# Patient Record
Sex: Female | Born: 1935 | Race: Black or African American | Hispanic: No | Smoking: Never smoker
Health system: Southern US, Community
[De-identification: ages and names within clinical notes are randomized; demographics above are authoritative.]

## PROBLEM LIST (undated history)

## (undated) DIAGNOSIS — I509 Heart failure, unspecified: Secondary | ICD-10-CM

## (undated) DIAGNOSIS — I2510 Atherosclerotic heart disease of native coronary artery without angina pectoris: Secondary | ICD-10-CM

## (undated) DIAGNOSIS — I119 Hypertensive heart disease without heart failure: Secondary | ICD-10-CM

## (undated) DIAGNOSIS — I5022 Chronic systolic (congestive) heart failure: Secondary | ICD-10-CM

## (undated) DIAGNOSIS — I252 Old myocardial infarction: Secondary | ICD-10-CM

## (undated) DIAGNOSIS — E785 Hyperlipidemia, unspecified: Secondary | ICD-10-CM

## (undated) DIAGNOSIS — I10 Essential (primary) hypertension: Secondary | ICD-10-CM

## (undated) HISTORY — PX: ABDOMINAL HYSTERECTOMY: SHX81

## (undated) HISTORY — DX: Atherosclerotic heart disease of native coronary artery without angina pectoris: I25.10

---

## 1999-01-11 ENCOUNTER — Emergency Department (HOSPITAL_COMMUNITY): Admission: EM | Admit: 1999-01-11 | Discharge: 1999-01-11 | Payer: Self-pay | Admitting: Emergency Medicine

## 1999-09-24 ENCOUNTER — Emergency Department (HOSPITAL_COMMUNITY): Admission: EM | Admit: 1999-09-24 | Discharge: 1999-09-24 | Payer: Self-pay | Admitting: Emergency Medicine

## 2002-10-16 ENCOUNTER — Encounter: Admission: RE | Admit: 2002-10-16 | Discharge: 2003-01-14 | Payer: Self-pay | Admitting: Family Medicine

## 2003-09-04 ENCOUNTER — Emergency Department (HOSPITAL_COMMUNITY): Admission: EM | Admit: 2003-09-04 | Discharge: 2003-09-04 | Payer: Self-pay | Admitting: Emergency Medicine

## 2005-11-17 ENCOUNTER — Inpatient Hospital Stay (HOSPITAL_COMMUNITY): Admission: EM | Admit: 2005-11-17 | Discharge: 2005-11-24 | Payer: Self-pay | Admitting: Emergency Medicine

## 2005-11-23 ENCOUNTER — Encounter (INDEPENDENT_AMBULATORY_CARE_PROVIDER_SITE_OTHER): Payer: Self-pay | Admitting: Cardiology

## 2005-11-28 ENCOUNTER — Emergency Department (HOSPITAL_COMMUNITY): Admission: EM | Admit: 2005-11-28 | Discharge: 2005-11-28 | Payer: Self-pay | Admitting: Emergency Medicine

## 2005-12-08 ENCOUNTER — Encounter (HOSPITAL_COMMUNITY): Admission: RE | Admit: 2005-12-08 | Discharge: 2006-03-08 | Payer: Self-pay | Admitting: Cardiology

## 2006-02-15 ENCOUNTER — Observation Stay (HOSPITAL_COMMUNITY): Admission: EM | Admit: 2006-02-15 | Discharge: 2006-02-18 | Payer: Self-pay | Admitting: Emergency Medicine

## 2006-02-16 ENCOUNTER — Encounter (INDEPENDENT_AMBULATORY_CARE_PROVIDER_SITE_OTHER): Payer: Self-pay | Admitting: Cardiology

## 2006-07-18 ENCOUNTER — Emergency Department (HOSPITAL_COMMUNITY): Admission: EM | Admit: 2006-07-18 | Discharge: 2006-07-18 | Payer: Self-pay | Admitting: Emergency Medicine

## 2006-12-05 IMAGING — CR DG CHEST 1V PORT
1 series · 1 of 1 positions shown · non-contrast
Comparison: 11/17/2005.

CLINICAL DATA: 79-year-old female, chest pain.  
 PORTABLE CHEST - 1 VIEW:

[view not recorded]
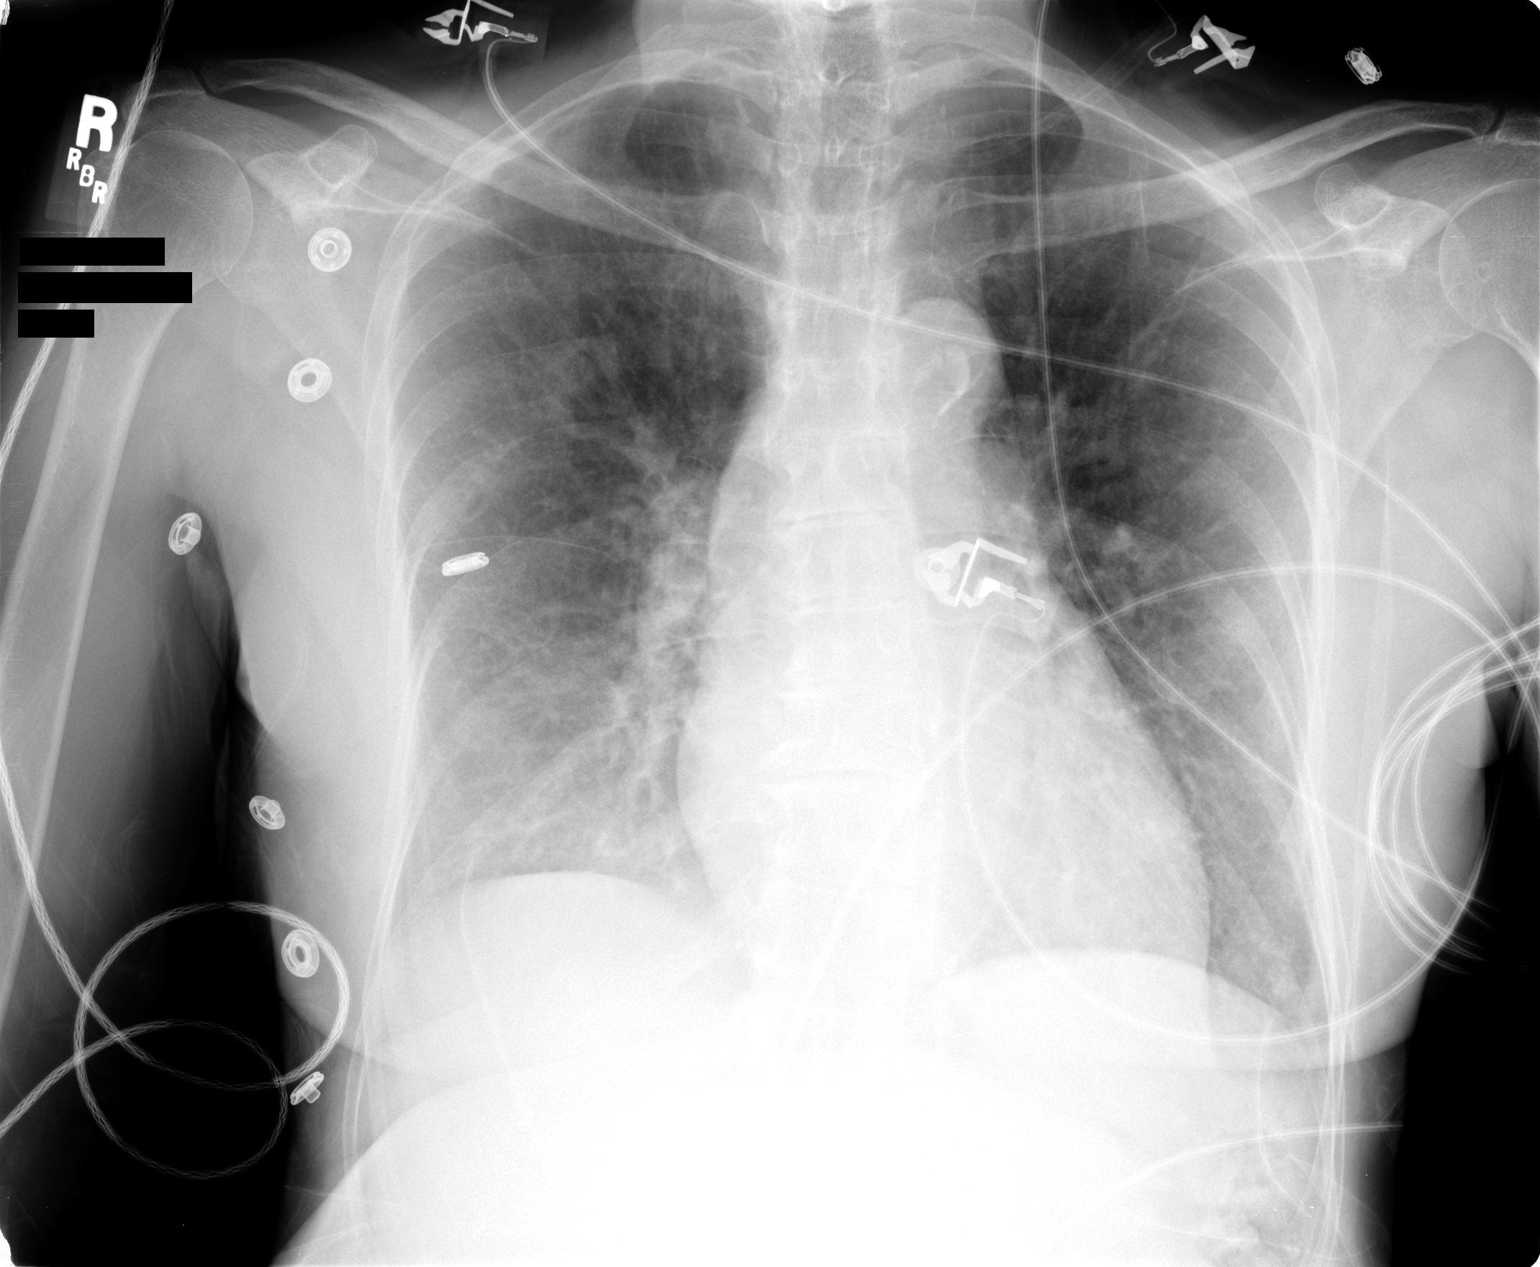

[1 of 1 positions shown; findings below may reference images not displayed]

FINDINGS: There is stable cardiomegaly with central bronchitic change and mild interstitial prominence.  No significant change in aeration.  No effusion or pneumothorax.  Lungs remain hyperinflated.
IMPRESSION: Cardiomegaly with mild interstitial prominence, and background COPD.

## 2007-03-04 IMAGING — CR DG CHEST 2V
2 series · 2 of 2 positions shown · non-contrast
Comparison: 02/15/06.

CLINICAL DATA: 79-year-old with CHF.
 CHEST ? 2 VIEW:

[view not recorded (1 of 2)]
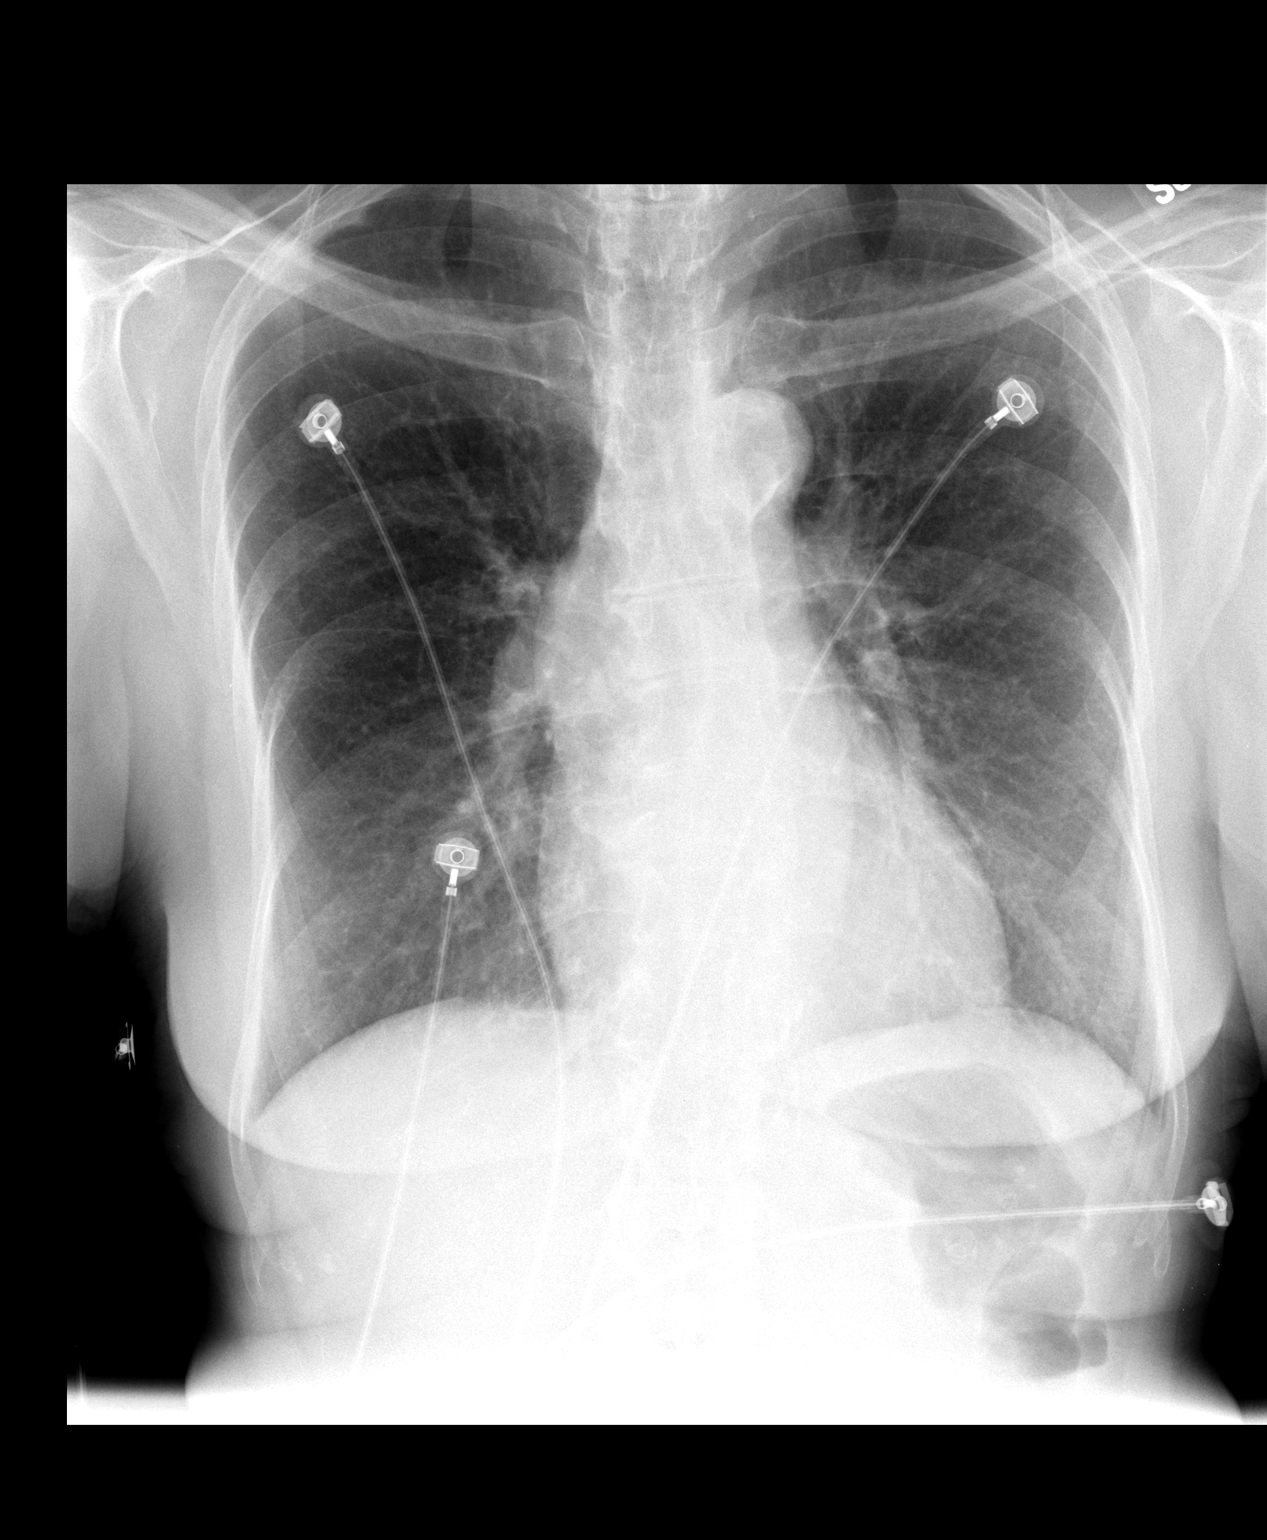

[view not recorded (2 of 2)]
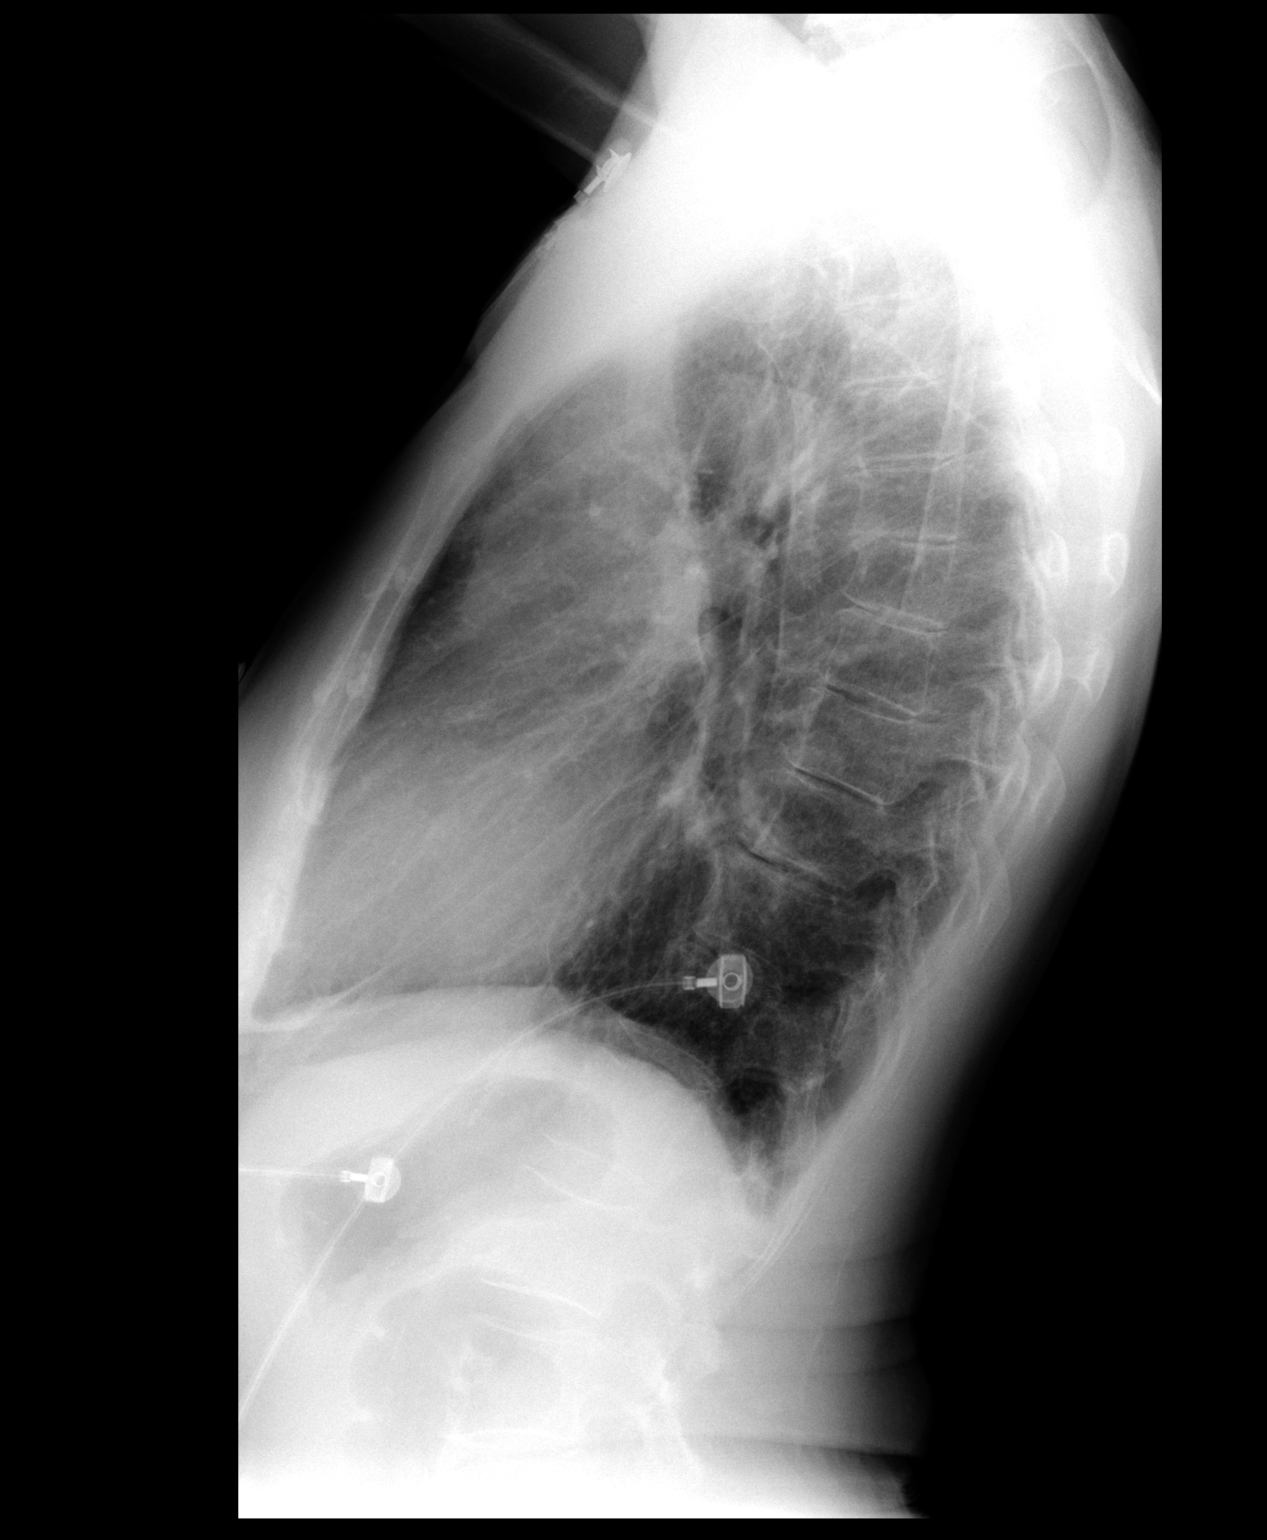

[2 of 2 positions shown; findings below may reference images not displayed]

FINDINGS: Cardiac silhouette, mediastinal and hilar contours are within normal limits.  Much improved aeration when compared to prior examination with resolution of CHF.  No effusions or infiltrates.
IMPRESSION: No acute cardiopulmonary findings.  Resolution of CHF.

## 2010-04-13 ENCOUNTER — Encounter: Admission: RE | Admit: 2010-04-13 | Discharge: 2010-04-13 | Payer: Self-pay | Admitting: Cardiology

## 2011-03-18 NOTE — H&P (Signed)
NAME:  Kristine Valdez, Kristine Valdez NO.:  [id number]   MEDICAL RECORD NO.:  [id number]          PATIENT TYPE:  INP   LOCATION:  1843                         FACILITY:  [facility]   PHYSICIAN:  Peter M. Swaziland, M.D.  DATE OF BIRTH:  10/31/1926   DATE OF ADMISSION:  11/17/2005  DATE OF DISCHARGE:                                HISTORY & PHYSICAL   HISTORY OF PRESENT ILLNESS:  Kristine Valdez is a very pleasant 77 year old  black female who presents with worsening chest pain today. She has a history  of a remote coronary stent approximately 10 years ago by Dr. Juanda Chance. She was  subsequently followed by Dr. Donnie Aho. She states she has not seen a  cardiologist in years. Approximately 1 week ago she developed some chest  pain with exertion that resolved with rest. Today, while carrying a load of  wood for her fire, she developed substernal chest pain. This resolved with  rest. She was subsequently making her bed when her pain recurred and did not  resolve until she presented to [facility] and she was  given two sublingual nitroglycerin and aspirin with resolution of her pain.  Now she states she just feels tired in her chest.   PAST MEDICAL HISTORY:  1.  Diabetes mellitus type 2, diet controlled.  2.  Hypertension.  3.  Hypercholesterolemia.  4.  History of sciatica.  5.  Depression.   MEDICATIONS:  Include aspirin and multivitamin daily. She is on Lopressor,  unknown dose, and hydrochlorothiazide.   ALLERGIES:  She is intolerant of MULTIPLE CHOLESTEROL MEDICATIONS which she  states causes her to have stomach cramps.   SOCIAL HISTORY:  She is married with multiple children. She denies tobacco  or alcohol use.   FAMILY HISTORY:  Father died of myocardial infarction. She had a brother who  had coronary bypass surgery and mother died with complications of diabetes.   REVIEW OF SYSTEMS:  Otherwise unremarkable.   PHYSICAL EXAMINATION:  GENERAL:  The patient is a  pleasant black female in  no distress.  VITAL SIGNS:  Her blood pressure is 130/70, pulse is 88 and regular,  respirations are normal, she is afebrile.  HEENT:  She is normocephalic, atraumatic. Pupils equal, round, and reactive  to light and accommodation. Oropharynx is clear.  NECK:  Supple without JVD, adenopathy, thyromegaly, or bruits.  LUNGS:  Clear.  CARDIAC:  Reveals regular rate and rhythm without gallop, murmur, rub, or  click.  ABDOMEN:  Soft and nontender. She has no masses or hepatosplenomegaly.  Femoral and pedal pulses are 2+ and symmetric. She has no edema.  NEUROLOGIC:  Nonfocal.   LABORATORY DATA:  ECG shows normal sinus rhythm with an old anterior septal  myocardial infarction. She has significant ST segment depression in leads I,  II, aVL, and V4 through V6 consistent with lateral ischemia, which are new.   IMPRESSION:  1.  Unstable angina, rule out myocardial infarction.  2.  Diabetes mellitus type 2.  3.  Hypertension.  4.  Hypercholesterolemia.   PLAN:  Will admit to telemetry. She will be  ruled out for myocardial  infarction. Will obtain routine labs, cardiac enzymes, chest x-ray. She will  be started on aspirin, IV heparin, IV nitroglycerin, and Integrilin. Will  continue her on her Lopressor and I will plan on cardiac catheterization in  the morning.           ______________________________  Peter M. [place]iland, M.D.     PMJ/MEDQ  D:  11/17/2005  T:  11/18/2005  Job:  [id number]   cc:   Windle Guard, M.D.  Fax: [phone]

## 2011-03-18 NOTE — Discharge Summary (Signed)
NAME:  Kristine Valdez, Kristine Valdez NO.:  [id number]   MEDICAL RECORD NO.:  [id number]          PATIENT TYPE:  INP   LOCATION:  4734                         FACILITY:  [facility]   PHYSICIAN:  Georga Hacking, M.D.DATE OF BIRTH:  1925-11-06   DATE OF ADMISSION:  11/17/2005  DATE OF DISCHARGE:  11/24/2005                                 DISCHARGE SUMMARY   FINAL DIAGNOSIS:  1.  Anterior infarction. Initial episode.  2.  Coronary artery disease.      1.  Status post stent of total occlusion of proximal left anterior          descending coronary at the site of a previous occlusion.  3.  Hypertension.  4.  Hyperlipidemia.  5.  Diabetes mellitus type 2.  6.  History of sciatica.  7.  Depression.   PROCEDURES:  Cardiac catheterization with stenting of the left inferior  descending.   HISTORY:  The patient is a 77 year old female who has a history of an  anterior infarction in 1995 treated with a coronary stent. She is known to  me for several years but has not been seen in about 9-10 years and has been  followed by her family physician. Around a week ago she developed exertional  substernal chest pain relieved with rest but developed increasing chest pain  the day of admission while carrying a load or wood in for her fire.  Pain  resolved with rest, but while making her bed, her pain returned and she  presented to [facility] and was given two nitroglycerin  sublingual and sent to the emergency room. Please see the previously  dictated history and physical for remainder of the details.   HOSPITAL COURSE:  Laboratory data on admission showed a sodium of 134,  potassium 3.6, chloride 102, glucose of 159, BUN was 17 with a creatinine of  1.1.  Initial CPK was 69 with MB of 1.7. Troponin was 0.03 on admission.  Her PT/PTT were normal.   She was placed on Integralin, heparin and IV nitroglycerin. She was held in  the emergency room overnight and had pain that  waxed and waned overnight  although the physician was not consulted.  T he next morning when she was  seen, her CPK had risen by 6:00 a.m. to 766 with 144 MV and a troponin of  4.71. She was taken to the cardiac catheterization laboratory where she was  found to have an occluded left anterior descending coronary artery.  The  circumflex had only mild disease in it.  The right coronary artery was a  nondominant vessel with an ostial 80-90% stenosis. She underwent angioplasty  and stenting of the LAD with late reperfusion being achieved due to the  length of time from admission to the onset of stenting. She had a 3.0 x 20  mm Taxus stent placed in the proximal LAD and had an excellent angiographic  results. She was watched in the intensive care unit for a couple of days.  Her CPK peaked at 2193 with 310 units of MD with rapid washout.  She was  placed on beta-blockers, ACE inhibitors and was seen by cardiac rehab. She  was initiated under lipid lowering therapy with Crestor.  She gradually  improved.  Her glucoses were in the mildly elevated range. Hemoglobin A1C  was 5.7.  After an appropriate period of time of rehab and to gain her  strength, she was discharged in improved condition. She refused to  Pneumovax, but was willing to take a flu vaccination.   She is discharged at this time in improved condition on:  1.  Aspirin 81 milligrams daily.  2.  Plavix 75 milligrams daily.  3.  Hydrochlorothiazide 25 milligrams daily.  4.  Vytorin 10/20 mg daily.  5.  Plavix 75 milligrams daily.  6.  Lisinopril 10 milligrams daily.  7.  Lopressor 50 milligrams b.i.d.   She is discharged to continue rehab and is to have outpatient rehab. She  will be seen by me in 1-2 weeks and call sooner if there are problems. She  was given a prescription for nitroglycerin also.      Georga Hacking, M.D.  Electronically Signed     WST/MEDQ  D:  11/24/2005  T:  11/24/2005  Job:  [id number]   cc:   Windle Guard, M.D.  Fax: [phone]

## 2011-03-18 NOTE — Discharge Summary (Signed)
NAME:  Kristine Valdez, Kristine Valdez NO.:  [id number]   MEDICAL RECORD NO.:  [id number]          PATIENT TYPE:  INP   LOCATION:  3713                         FACILITY:  [facility]   PHYSICIAN:  Georga Hacking, M.D.DATE OF BIRTH:  24-Dec-1925   DATE OF ADMISSION:  02/15/2006  DATE OF DISCHARGE:  02/18/2006                                 DISCHARGE SUMMARY   FINAL DIAGNOSES:  1.  Congestive heart failure.  2.  Coronary artery disease with previous anterior infarction.  3.  Possible apical mural thrombus.  4.  Hypertension.  5.  Hyperlipidemia.  6.  Peripheral vascular disease.   PROCEDURES:  Echocardiogram.   HISTORY:  This 77 year old female was admitted for treatment of heart  failure and elevated blood pressure.  She has a previous history of  hypertension, hyperlipidemia, previous anterior MI in 1995, treated with  angioplasty.  She had a subacute anterior infarction with a somewhat light  presentation.  Had stenting done in January, 2007 with a Taxus stent.  She  was unable to tolerate ACE inhibitors because of cough and later switched to  Micardis, it was thought because it made her blurry.  She had dyspnea on  exertion and continued taking HCTZ several weeks ago.  She noticed  increasing fatigue with exertion as well as PND, orthopnea, and worsening  shortness of breath.  She saw Dr. Jeannetta Nap on Monday.  He wrote the Mucinex,  thinking it was allergies.  She presented to the emergency room being unable  to sleep at night and was really having difficulty and was admitted for  treatment of congestive heart failure.  Please see the previously dictated  history and physical for the remainder of the details.   HOSPITAL COURSE:  Laboratory data on admission showed a hemoglobin of 12.8,  hematocrit 38.3.  Pro time, INR was 1.2.  BUN was 11 on admission with a  creatinine of 1.  Sodium is 136, potassium 4.3, chloride 104.  Hemoglobin  A1C was 5.2.  AST was 57, ALT 58.  B-type  natriuretic peptide was 875.  TSH  was 1.488.   Chest x-ray showed congestive heart failure.   EKG showed a previous anterior infarction.   Patient was admitted to the hospital and was given intravenous Lasix.  Echocardiogram showed an EF of around 25% with the possibility of an apical  mural thrombus.  She was seen the next day and was feeling better with no  PND and denied chest pain.   BNP level was 875, and chest x-ray was consistent with heart failure.   An echocardiogram was checked, and she was changed to Lasix the next  morning.  On the 20th, she felt better with no PND, orthopnea, and less  fatigue but had a more rapid heart rate.  She was changed to Coreg, and it  was noted that her BUN had risen to 29 from 11 and her creatinine to 1.4  from 0.9.  She had been started on spironolactone, and she was given a trial  of Atacand as well as Aldactone.  The next day, her potassium  was 5.2, and  the Aldactone was held.  It was thought necessary to keep her in the  hospital until her renal function had stabilized.  Her BUN was 334 with a  creatinine of 1.4 the next day and was thought to be stable.  BNP level had  now dropped to 70.   She was discharged in improved condition that next day to continue  outpatient treatment.  She also is watched on Atacand.   DISCHARGE MEDICATIONS:  1.  Plavix 75 mg daily.  2.  Vytorin 10/20 mg daily.  3.  Atacand 4 mg daily.  4.  Lasix 40 mg daily.  5.  Coreg 6.25 mg b.i.d.  6.  Coumadin 5 mg daily.  7.  Aspirin 81 mg daily.  8.  She is to discontinue Lopressor.   It is recommended that she follow up in the office on April 24 for blood  work and is to call if there are problems.      Georga Hacking, M.D.  Electronically Signed     WST/MEDQ  D:  03/15/2006  T:  03/16/2006  Job:  [id number]   cc:   Windle Guard, M.D.  Fax: [phone]

## 2011-03-18 NOTE — Cardiovascular Report (Signed)
NAME:  Kristine Valdez, KRZYWICKI NO.:  [id number]   MEDICAL RECORD NO.:  [id number]          PATIENT TYPE:  INP   LOCATION:  2116                         FACILITY:  [facility]   PHYSICIAN:  Georga Hacking, M.D.DATE OF BIRTH:  1926-02-25   DATE OF PROCEDURE:  11/18/2005  DATE OF DISCHARGE:                              CARDIAC CATHETERIZATION   HISTORY:  The patient is a 77 year old black female with a previous anterior  infarction who had a J&J stent placed in the LAD 10 years ago in the setting  of an acute infarction.  She has not been seen by a cardiologist in years.  One week ago she developed severe chest discomfort with exertion that  resolved and developed worsening chest discomfort the day of admission.  It  eased off but later while making her bed she had recurrent pain that did not  resolve until she took two nitroglycerin sublingually at [facility].  The pain had resolved and she was feeling tired on  admission to the emergency room.  EKG showed some ST depression and some ST  elevation.  She was not in any acute distress and was placed on heparin and  denied chest pain.  She had recurrent chest discomfort last evening around  10 o'clock and had chest discomfort throughout the night, treated with  morphine.  She was brought to the laboratory because of ongoing chest  discomfort as well as elevated cardiac enzymes.   PROCEDURE:  Left heart catheterization with coronary angiograms, left  ventriculogram, and stents of the left anterior descending artery through a  previous J&H stent.   COMMENTS ABOUT PROCEDURE:  The patient was brought to the catheterization  laboratory from the floor and was having only mild chest discomfort.  She  was stable on arrival.  After Xylocaine anesthesia, a 6-French sheath was  placed right femoral artery percutaneously.  Angiograms were made using 6-  French catheters, and a 25 mL ventriculogram was performed.  ACT  was 221,  and she was given an additional 2000 is of heparin, yielding an ACT of 276.  Integrilin and nitroglycerin were continued.  She was found to have an  occlusion of the proximal LAD with collaterals to the distal vessel.  The  occlusion site was at the site of the previous J&J stent.  She underwent  angioplasty.  The sheath was exchanged for a 7-French sheath, and a 7-French  JL-3.5 guiding catheter was used.  A 0.014 HTF-J guidewire was advanced  across the total occlusion and was positioned in the distal vessel with some  difficulty.  The lesion was opened with the 2.5 x 12-mm Maverick balloon.  Following this, she had placement of a 20 mm x 3.0 Taxus drug-eluting stent.  I attempted to wire the diagonal branch proximally, it was at the site of  the previous occlusion, but was unable to do so and left this unprotected.  I initially placed a 24 mm x 3.0 Taxus stent, but this was too long and this  was withdrawn without complications.  The 20 mm length tended to  fit better.  This was dilated to 15 atmospheres and then postdilated with a 3.25 x 12 mm  Quantum balloon and proximally was dilated with a 3.75 x 8-mm Quantum  balloon.  This was dilated to high pressures on those accounts.  She had  TIMI grade 3 flow and has had no chest discomfort the following the  procedure.  The sheath was sutured in place.   HEMODYNAMIC DATA:  1.  Aorta post contrast:  100/63.  2.  LV post contrast 100/15-30.   ANGIOGRAPHIC DATA:  1.  Left ventriculogram:  Performed in the 30-degree RAO projection.  The      aortic valve is normal.  Mitral valve has mild to moderate mitral      regurgitation.  The left ventricle is normal in size.  There is akinesis      of the anteroapical segment noted.  The estimated ejection fraction is      30%.  2.  Coronary arteries arise and distribute normally. There is appears to be      a left-dominant system present noted.  3.  The left main coronary artery is short.   There appeared to be dual      ostium of the circumflex and LAD.  The ostium of the circumflex was      difficult to lay out and may have some mild disease involving it.  The      LAD appears normal.  4.  The left anterior descending is occluded proximally at the proximal edge      of the previously-placed J&J stent.  A moderate-sized diagonal branch      arises and has an ostial 80% stenosis at its origin.  This supplies      collaterals to the distal LAD.  5.  The circumflex coronary artery appears to be a codominant vessel or a      dominant vessel and has several tortuous marginal arteries that appear      to be free of disease.  6.  The right coronary artery has catheter damping assisted with the ostium      likely has a 90% ostial stenosis noted.  This supplies mainly right      ventricular branches.   Post-dilatation angiograms of the LAD showed 0% residual stenosis and TIMI  grade 3 flow.  There appears to be a 60-70% midvessel stenosis, which was  not addressed during this intervention.   IMPRESSION:  1.  Successful stenting of the left anterior descending artery yielding late      reperfusion of an acute anterior infarction.  2.  Residual disease involving the mid left anterior descending and ostium      of the right coronary.  3.  Significant left ventricular dysfunction.   RECOMMENDATIONS:  The patient will be returned to the unit.  Her sheath will  be removed later.  __________ in 18 hours.  She will placed on Plavix.      Georga Hacking, M.D.  Electronically Signed     WST/MEDQ  D:  11/18/2005  T:  11/19/2005  Job:  [id number]   cc:   Windle Guard, M.D.  Fax: [phone]

## 2011-03-18 NOTE — H&P (Signed)
NAME:  Kristine Valdez, Kristine Valdez NO.:  [id number]   MEDICAL RECORD NO.:  [id number]          PATIENT TYPE:  EMS   LOCATION:  MAJO                         FACILITY:  [facility]   PHYSICIAN:  Georga Hacking, M.D.DATE OF BIRTH:  1926-07-23   DATE OF ADMISSION:  02/15/2006  DATE OF DISCHARGE:                                HISTORY & PHYSICAL   REASON FOR ADMISSION:  Shortness of breath   HISTORY:  This 77 year old female was brought into the hospital for  treatment of congestive heart failure and elevated blood pressure.  The  patient has a previous history of hypertension and hyperlipidemia and had a  previous myocardial infarction in 1995 treated with angioplasty.  She  presented with a subacute anterior infarction with a somewhat late  presentation and had stenting done by myself in January 2007.  This was done  with a Taxus stent.  She made a reasonable recovery from this and has been  followed in the office.  She was unable to tolerate ACE inhibitors because  of cough and later switched to Micardis that she also stopped because it  made her eyes blurry.  She has had some mild dyspnea on exertion and  discontinued taking hydrochlorothiazide several weeks ago.  She had noticed  recent increasing fatigue with exertion as well as PND, orthopnea, and some  worsening shortness of breath, and saw Dr. Jeannetta Nap' office on Monday and was  treated with Mucinex, thinking it was allergies.  She presented to the  emergency room being unable to sleep at night and really having difficulty,  and is admitted at this time for treatment of congestive heart failure.   PAST HISTORY:  Remarkable for:  1.  Hypertension.  2.  Hyperlipidemia.  3.  Peripheral vascular disease.  4.  Previous history of diabetes.   PREVIOUS SURGERY:  Hysterectomy.   ALLERGIES:  1.  CORTISONE.  2.  PENICILLIN.  3.  ACE INHIBITORS cause cough.   CURRENT MEDICATIONS:  1.  Aspirin 81 mg daily.  2.  Metoprolol 50  mg b.i.d.  3.  Plavix 75 mg daily.  4.  Vytorin 10/20 mg daily.   FAMILY HISTORY:  Father died age 59 with heart disease.  Mother died age 75  of diabetes and history of heart failure.  Brother died of cancer of the  prostate.  Sister died of cancer of the breast.   SOCIAL HISTORY:  She currently lives alone and is a widow.  She has 12  children - seven sons and five daughters.  She is a nonsmoker and does not  use alcohol to excess.   REVIEW OF SYSTEMS:  No recent weight change.  Does have significant fatigue.  She has no skin problems.  She wears eyeglasses and has occasional  cataracts.  No glaucoma or macular degeneration.  No difficulty with  hearing, eyes, nose, or throat.  She has had PND, orthopnea, but no edema  recently.  She has not had any chest pain suggestive of angina but does have  occasional palpitations.  She denies dyspepsia, ulcers, GI bleeding,  constipation, or  diarrhea.  No dysuria, urgency, frequency, UTIs, or stress  incontinence.  She has mild generalized arthritis.  No venous insufficiency  or weakness.  She denies headache, stroke or TIA.   EXAMINATION:  GENERAL:  She is a pleasant, elderly black female in no acute  distress.  VITAL SIGNS:  Her blood pressure is currently 170/90, pulse was 70 and  regular.  SKIN:  Warm and dry.  ENT:  Wears glasses.  EOMI, PERRLA, C&S clear.  Fundi not examined.  Pharynx  negative.  NECK:  Supple without masses or carotid bruits.  There is mild JVD noted.  LUNGS:  Mild basilar crackles.  CARDIAC:  Normal S1 and S2.  There is no murmur, no definite S3.  ABDOMEN:  Soft, nontender, no edema.  Peripheral pulses are 1+.   A 12-lead EKG shows previous anterior infarction, T-wave inversions in the  anterolateral leads.  Initial cardiac enzymes are negative.  The rest of the  laboratory is pending at the time of dictation.   IMPRESSION:  1.  Paroxysmal nocturnal dyspnea, orthopnea, worsening dyspnea on exertion       consistent with heart failure, intolerance previously to ACE inhibitors.  2.  Coronary artery disease with previous anterior infarction and Taxus      stent to the left anterior descending coronary artery.  3.  Hypertension, not well controlled.  4.  Hyperlipidemia, under treatment.  5.  Peripheral vascular disease.   RECOMMENDATIONS:  Admit to hospital.  Check serial enzymes and EKG.  Check  echocardiogram.  Intravenous diuresis with Lasix.  Re-trial of ARB  inhibitor.  Check screening laboratory as well as BNP.      Georga Hacking, M.D.  Electronically Signed     WST/MEDQ  D:  02/15/2006  T:  02/15/2006  Job:  [id number]   cc:   Windle Guard, M.D.  Fax: [phone]

## 2011-12-01 DIAGNOSIS — I5043 Acute on chronic combined systolic (congestive) and diastolic (congestive) heart failure: Secondary | ICD-10-CM

## 2011-12-01 DIAGNOSIS — I5023 Acute on chronic systolic (congestive) heart failure: Secondary | ICD-10-CM

## 2011-12-01 HISTORY — DX: Acute on chronic systolic (congestive) heart failure: I50.23

## 2011-12-02 ENCOUNTER — Encounter (HOSPITAL_COMMUNITY): Payer: Self-pay | Admitting: Emergency Medicine

## 2011-12-02 ENCOUNTER — Other Ambulatory Visit: Payer: Self-pay

## 2011-12-02 ENCOUNTER — Encounter (HOSPITAL_COMMUNITY)
Admission: EM | Disposition: A | Discharge status: Home / Self Care | Payer: Self-pay | Source: Home / Self Care | Attending: Cardiology

## 2011-12-02 ENCOUNTER — Emergency Department (HOSPITAL_COMMUNITY): Payer: Medicare Other

## 2011-12-02 ENCOUNTER — Inpatient Hospital Stay (HOSPITAL_COMMUNITY)
Admission: EM | Admit: 2011-12-02 | Discharge: 2011-12-03 | DRG: 287 | Disposition: A | Discharge status: Home / Self Care | Payer: Medicare Other | Attending: Cardiology | Admitting: Cardiology

## 2011-12-02 DIAGNOSIS — R079 Chest pain, unspecified: Secondary | ICD-10-CM

## 2011-12-02 DIAGNOSIS — R059 Cough, unspecified: Secondary | ICD-10-CM | POA: Diagnosis present

## 2011-12-02 DIAGNOSIS — I509 Heart failure, unspecified: Secondary | ICD-10-CM | POA: Diagnosis present

## 2011-12-02 DIAGNOSIS — E782 Mixed hyperlipidemia: Secondary | ICD-10-CM | POA: Insufficient documentation

## 2011-12-02 DIAGNOSIS — I2510 Atherosclerotic heart disease of native coronary artery without angina pectoris: Secondary | ICD-10-CM

## 2011-12-02 DIAGNOSIS — E876 Hypokalemia: Secondary | ICD-10-CM | POA: Diagnosis present

## 2011-12-02 DIAGNOSIS — R0602 Shortness of breath: Secondary | ICD-10-CM | POA: Diagnosis present

## 2011-12-02 DIAGNOSIS — E119 Type 2 diabetes mellitus without complications: Secondary | ICD-10-CM | POA: Diagnosis present

## 2011-12-02 DIAGNOSIS — I5023 Acute on chronic systolic (congestive) heart failure: Secondary | ICD-10-CM | POA: Diagnosis present

## 2011-12-02 DIAGNOSIS — I5022 Chronic systolic (congestive) heart failure: Secondary | ICD-10-CM

## 2011-12-02 DIAGNOSIS — I252 Old myocardial infarction: Secondary | ICD-10-CM | POA: Insufficient documentation

## 2011-12-02 DIAGNOSIS — Y831 Surgical operation with implant of artificial internal device as the cause of abnormal reaction of the patient, or of later complication, without mention of misadventure at the time of the procedure: Secondary | ICD-10-CM | POA: Diagnosis present

## 2011-12-02 DIAGNOSIS — I5043 Acute on chronic combined systolic (congestive) and diastolic (congestive) heart failure: Secondary | ICD-10-CM

## 2011-12-02 DIAGNOSIS — I110 Hypertensive heart disease with heart failure: Principal | ICD-10-CM | POA: Diagnosis present

## 2011-12-02 DIAGNOSIS — Z7902 Long term (current) use of antithrombotics/antiplatelets: Secondary | ICD-10-CM

## 2011-12-02 DIAGNOSIS — I119 Hypertensive heart disease without heart failure: Secondary | ICD-10-CM

## 2011-12-02 DIAGNOSIS — R05 Cough: Secondary | ICD-10-CM | POA: Diagnosis present

## 2011-12-02 DIAGNOSIS — Z9861 Coronary angioplasty status: Secondary | ICD-10-CM

## 2011-12-02 DIAGNOSIS — E785 Hyperlipidemia, unspecified: Secondary | ICD-10-CM | POA: Diagnosis present

## 2011-12-02 DIAGNOSIS — T82897A Other specified complication of cardiac prosthetic devices, implants and grafts, initial encounter: Secondary | ICD-10-CM | POA: Diagnosis present

## 2011-12-02 DIAGNOSIS — I200 Unstable angina: Secondary | ICD-10-CM | POA: Diagnosis present

## 2011-12-02 HISTORY — PX: LEFT HEART CATHETERIZATION WITH CORONARY ANGIOGRAM: SHX5451

## 2011-12-02 HISTORY — DX: Essential (primary) hypertension: I10

## 2011-12-02 HISTORY — DX: Old myocardial infarction: I25.2

## 2011-12-02 HISTORY — DX: Heart failure, unspecified: I50.9

## 2011-12-02 HISTORY — DX: Atherosclerotic heart disease of native coronary artery without angina pectoris: I25.10

## 2011-12-02 HISTORY — DX: Hyperlipidemia, unspecified: E78.5

## 2011-12-02 HISTORY — DX: Chronic systolic (congestive) heart failure: I50.22

## 2011-12-02 HISTORY — DX: Hypertensive heart disease without heart failure: I11.9

## 2011-12-02 LAB — BASIC METABOLIC PANEL
BUN: 22 mg/dL (ref 6–23)
CO2: 25 mEq/L (ref 19–32)
Calcium: 9.7 mg/dL (ref 8.4–10.5)
Chloride: 102 mEq/L (ref 96–112)
Creatinine, Ser: 1.04 mg/dL (ref 0.50–1.10)
GFR calc Af Amer: 56 mL/min — ABNORMAL LOW (ref >90)
GFR calc non Af Amer: 48 mL/min — ABNORMAL LOW (ref >90)
Glucose, Bld: 170 mg/dL — ABNORMAL HIGH (ref 70–99)
Potassium: 3 mEq/L — ABNORMAL LOW (ref 3.5–5.1)
Sodium: 140 mEq/L (ref 135–145)

## 2011-12-02 LAB — CBC
HCT: 38.8 % (ref 36.0–46.0)
HCT: 40.9 % (ref 36.0–46.0)
Hemoglobin: 13.3 g/dL (ref 12.0–15.0)
Hemoglobin: 14.5 g/dL (ref 12.0–15.0)
MCH: 33.2 pg (ref 26.0–34.0)
MCHC: 34.3 g/dL (ref 30.0–36.0)
MCV: 96.8 fL (ref 78.0–100.0)
Platelets: 213 10*3/uL (ref 150–400)
RBC: 4.01 MIL/uL (ref 3.87–5.11)
RBC: 4.27 MIL/uL (ref 3.87–5.11)
RBC: 4.24 MIL/uL (ref 3.87–5.11)
RDW: 13.3 % (ref 11.5–15.5)
RDW: 13.3 % (ref 11.5–15.5)
WBC: 7.1 10*3/uL (ref 4.0–10.5)
WBC: 5.3 10*3/uL (ref 4.0–10.5)

## 2011-12-02 LAB — DIFFERENTIAL
Basophils Absolute: 0 10*3/uL (ref 0.0–0.1)
Basophils Relative: 0 % (ref 0–1)
Eosinophils Absolute: 0.2 10*3/uL (ref 0.0–0.7)
Eosinophils Relative: 3 % (ref 0–5)
Lymphocytes Relative: 53 % — ABNORMAL HIGH (ref 12–46)
Lymphs Abs: 3.8 10*3/uL (ref 0.7–4.0)
Monocytes Absolute: 0.7 10*3/uL (ref 0.1–1.0)
Monocytes Relative: 10 % (ref 3–12)
Neutro Abs: 2.4 10*3/uL (ref 1.7–7.7)
Neutrophils Relative %: 34 % — ABNORMAL LOW (ref 43–77)

## 2011-12-02 LAB — PRO B NATRIURETIC PEPTIDE: Pro B Natriuretic peptide (BNP): 2423 pg/mL — ABNORMAL HIGH (ref 0–450)

## 2011-12-02 LAB — HEMOGLOBIN A1C
Hgb A1c MFr Bld: 5.6 % (ref <5.7)
Mean Plasma Glucose: 114 mg/dL (ref <117)

## 2011-12-02 LAB — POCT I-STAT TROPONIN I: Troponin i, poc: 0 ng/mL (ref 0.00–0.08)

## 2011-12-02 LAB — COMPREHENSIVE METABOLIC PANEL
AST: 21 U/L (ref 0–37)
Albumin: 3.8 g/dL (ref 3.5–5.2)
Alkaline Phosphatase: 82 U/L (ref 39–117)
BUN: 17 mg/dL (ref 6–23)
CO2: 29 mEq/L (ref 19–32)
Chloride: 102 mEq/L (ref 96–112)
GFR calc non Af Amer: 55 mL/min — ABNORMAL LOW (ref >90)
Potassium: 3.5 mEq/L (ref 3.5–5.1)
Total Bilirubin: 0.5 mg/dL (ref 0.3–1.2)

## 2011-12-02 LAB — LIPID PANEL
LDL Cholesterol: 164 mg/dL — ABNORMAL HIGH (ref 0–99)
VLDL: 14 mg/dL (ref 0–40)

## 2011-12-02 LAB — CARDIAC PANEL(CRET KIN+CKTOT+MB+TROPI)
Relative Index: INVALID (ref 0.0–2.5)
Relative Index: INVALID (ref 0.0–2.5)
Total CK: 75 U/L (ref 7–177)
Troponin I: 0.77 ng/mL (ref <0.30)
Troponin I: 0.65 ng/mL (ref <0.30)
Troponin I: 0.45 ng/mL (ref <0.30)

## 2011-12-02 LAB — CREATININE, SERUM
GFR calc Af Amer: 67 mL/min — ABNORMAL LOW (ref >90)
GFR calc non Af Amer: 58 mL/min — ABNORMAL LOW (ref >90)

## 2011-12-02 LAB — PROTIME-INR
INR: 1.13 (ref 0.00–1.49)
Prothrombin Time: 14.7 seconds (ref 11.6–15.2)

## 2011-12-02 SURGERY — LEFT HEART CATHETERIZATION WITH CORONARY ANGIOGRAM
Anesthesia: LOCAL

## 2011-12-02 MED ORDER — CANDESARTAN CILEXETIL 8 MG PO TABS: 8.0000 mg | ORAL_TABLET | Freq: Every day | ORAL | Status: DC

## 2011-12-02 MED ORDER — ASPIRIN 300 MG RE SUPP: 300.0000 mg | RECTAL | Status: DC

## 2011-12-02 MED ORDER — POTASSIUM CHLORIDE 20 MEQ/15ML (10%) PO LIQD
40.0000 meq | Freq: Once | ORAL | Status: AC
  Administered 2011-12-02: 40 meq via ORAL
  Filled 2011-12-02: qty 30

## 2011-12-02 MED ORDER — SODIUM CHLORIDE 0.9 % IV SOLN: INTRAVENOUS | Status: DC

## 2011-12-02 MED ORDER — POTASSIUM CHLORIDE 20 MEQ/15ML (10%) PO LIQD
ORAL | Status: AC
  Filled 2011-12-02: qty 30

## 2011-12-02 MED ORDER — METOPROLOL TARTRATE 50 MG PO TABS
50.0000 mg | ORAL_TABLET | Freq: Two times a day (BID) | ORAL | Status: DC
  Administered 2011-12-02 – 2011-12-03 (×2): 50 mg via ORAL
  Filled 2011-12-02 (×3): qty 1

## 2011-12-02 MED ORDER — ACETAMINOPHEN 325 MG PO TABS: 650.0000 mg | ORAL_TABLET | ORAL | Status: DC | PRN

## 2011-12-02 MED ORDER — ASPIRIN 81 MG PO CHEW: 324.0000 mg | CHEWABLE_TABLET | Freq: Once | ORAL | Status: DC

## 2011-12-02 MED ORDER — METOPROLOL TARTRATE 25 MG PO TABS
ORAL_TABLET | ORAL | Status: AC
  Filled 2011-12-02: qty 2

## 2011-12-02 MED ORDER — NITROGLYCERIN 0.4 MG SL SUBL: 0.4000 mg | SUBLINGUAL_TABLET | SUBLINGUAL | Status: DC | PRN

## 2011-12-02 MED ORDER — ENOXAPARIN SODIUM 40 MG/0.4ML ~~LOC~~ SOLN
40.0000 mg | SUBCUTANEOUS | Status: DC
  Administered 2011-12-03: 40 mg via SUBCUTANEOUS
  Filled 2011-12-02 (×2): qty 0.4

## 2011-12-02 MED ORDER — METOPROLOL TARTRATE 25 MG PO TABS
50.0000 mg | ORAL_TABLET | Freq: Two times a day (BID) | ORAL | Status: DC
  Filled 2011-12-02: qty 2

## 2011-12-02 MED ORDER — CLOPIDOGREL BISULFATE 75 MG PO TABS
ORAL_TABLET | ORAL | Status: AC
  Administered 2011-12-02: 75 mg via ORAL
  Filled 2011-12-02: qty 1

## 2011-12-02 MED ORDER — NITROGLYCERIN 0.2 MG/ML ON CALL CATH LAB
INTRAVENOUS | Status: AC
  Filled 2011-12-02: qty 1

## 2011-12-02 MED ORDER — DIAZEPAM 5 MG PO TABS
ORAL_TABLET | ORAL | Status: AC
  Administered 2011-12-02: 5 mg via ORAL
  Filled 2011-12-02: qty 1

## 2011-12-02 MED ORDER — ASPIRIN 81 MG PO CHEW
324.0000 mg | CHEWABLE_TABLET | ORAL | Status: AC
  Administered 2011-12-02: 324 mg via ORAL

## 2011-12-02 MED ORDER — OLMESARTAN MEDOXOMIL 20 MG PO TABS
20.0000 mg | ORAL_TABLET | Freq: Every day | ORAL | Status: DC
  Administered 2011-12-02 – 2011-12-03 (×2): 20 mg via ORAL
  Filled 2011-12-02 (×2): qty 1

## 2011-12-02 MED ORDER — FUROSEMIDE 20 MG PO TABS: 40.0000 mg | ORAL_TABLET | Freq: Every day | ORAL | Status: DC

## 2011-12-02 MED ORDER — SODIUM CHLORIDE 0.9 % IV SOLN: 1.0000 mL/kg/h | INTRAVENOUS | Status: AC

## 2011-12-02 MED ORDER — ASPIRIN EC 81 MG PO TBEC
81.0000 mg | DELAYED_RELEASE_TABLET | Freq: Every day | ORAL | Status: DC
  Administered 2011-12-03: 81 mg via ORAL
  Filled 2011-12-02: qty 1

## 2011-12-02 MED ORDER — OLMESARTAN 10 MG HALF TABLET
10.0000 mg | ORAL_TABLET | ORAL | Status: AC
  Administered 2011-12-02: 10 mg via ORAL
  Filled 2011-12-02 (×2): qty 1

## 2011-12-02 MED ORDER — HEPARIN (PORCINE) IN NACL 2-0.9 UNIT/ML-% IJ SOLN
INTRAMUSCULAR | Status: AC
  Filled 2011-12-02: qty 2000

## 2011-12-02 MED ORDER — FUROSEMIDE 40 MG PO TABS
40.0000 mg | ORAL_TABLET | Freq: Every day | ORAL | Status: DC
  Administered 2011-12-02 – 2011-12-03 (×2): 40 mg via ORAL
  Filled 2011-12-02 (×2): qty 1

## 2011-12-02 MED ORDER — ONDANSETRON HCL 4 MG/2ML IJ SOLN: 4.0000 mg | Freq: Four times a day (QID) | INTRAMUSCULAR | Status: DC | PRN

## 2011-12-02 MED ORDER — ENOXAPARIN SODIUM 40 MG/0.4ML ~~LOC~~ SOLN
40.0000 mg | SUBCUTANEOUS | Status: DC
  Filled 2011-12-02: qty 0.4

## 2011-12-02 MED ORDER — FUROSEMIDE 10 MG/ML IJ SOLN
40.0000 mg | Freq: Once | INTRAMUSCULAR | Status: AC
  Administered 2011-12-02: 40 mg via INTRAVENOUS
  Filled 2011-12-02: qty 4

## 2011-12-02 MED ORDER — ASPIRIN 81 MG PO CHEW
CHEWABLE_TABLET | ORAL | Status: AC
  Filled 2011-12-02: qty 4

## 2011-12-02 MED ORDER — HEPARIN SODIUM (PORCINE) 5000 UNIT/ML IJ SOLN: 5000.0000 [IU] | Freq: Three times a day (TID) | INTRAMUSCULAR | Status: DC

## 2011-12-02 MED ORDER — DIAZEPAM 5 MG PO TABS
5.0000 mg | ORAL_TABLET | ORAL | Status: DC
  Administered 2011-12-02: 5 mg via ORAL

## 2011-12-02 MED ORDER — LIDOCAINE HCL (PF) 1 % IJ SOLN
INTRAMUSCULAR | Status: AC
  Filled 2011-12-02: qty 30

## 2011-12-02 MED ORDER — CLOPIDOGREL BISULFATE 75 MG PO TABS
75.0000 mg | ORAL_TABLET | Freq: Every day | ORAL | Status: DC
  Administered 2011-12-02 – 2011-12-03 (×2): 75 mg via ORAL
  Filled 2011-12-02: qty 1

## 2011-12-02 MED ORDER — LABETALOL HCL 5 MG/ML IV SOLN
INTRAVENOUS | Status: AC
  Filled 2011-12-02: qty 4

## 2011-12-02 NOTE — H&P (Signed)
   Patient seen and examined.  No interval change in history and exam since last note earlier.   Stable for procedure.  Darden Palmer. MD [facility]  12/02/2011

## 2011-12-02 NOTE — Progress Notes (Signed)
Subjective:  Admitted last night with vague history, had frothy sputum and chest tightness relieved with 2 NTG.  Currently pain free  Objective:  Vital Signs in the last 24 hours: BP 154/85  Pulse 96  Temp(Src) 98.1 F (36.7 C) (Oral)  Resp 17  SpO2 99%  Physical Exam: Pleasant BF in NAD appears younger than stated age Lungs:  Clear to A&P Cardiac:  Regular rhythm, normal S1 and S2, no S3, S4 Abdomen:  Soft, nontender, no masses Extremities:  No edema present  Intake/Output from previous day: 01/31 0701 - 02/01 0700 In: -  Out: 700 [Urine:700] Weight change:   Lab Results: Basic Metabolic Panel:  Basename 12/02/11 0331  NA 140  K 3.0*  CL 102  CO2 25  GLUCOSE 170*  BUN 22  CREATININE 1.04   CBC:  Basename 12/02/11 0331  WBC 7.1  NEUTROABS 2.4  HGB 13.3  HCT 38.8  MCV 96.8  PLT 213   Protime: . Lab Results  Component Value Date   INR 1.13 12/02/2011    Telemetry: Reviewed : sinus tachycardia  Assessment/Plan:  1. Probable UAP 2. CHF with LV dysfunction 3. Hypertensive heart disease 4. Diabetes  REC:  Vague history, but out of character for her.  Concerned with unstable angina. Will check BNP and give her meds.  Check ECHO.  I think we need to assess for progression of CAD.  Cardiac catheterization was discussed with the patient fully including risks of myocardial infarction, death, stroke, bleeding, arrhythmia, dye allergy, renal insufficiency or bleeding.  The patient understands and is willing to proceed.Family present in room and questions answered.  Kristine Valdez.  MD [facility] 12/02/2011, 9:23 AM

## 2011-12-02 NOTE — ED Notes (Signed)
Informed patient and/or family of status. No voiced complaints presently. NAD. Awaiting bed assignment.  

## 2011-12-02 NOTE — ED Notes (Signed)
Dr. Tilley at bedside. 

## 2011-12-02 NOTE — H&P (Signed)
Kristine Valdez is an 78 y.o. female.   Chief Complaint: chest pain HPI: Kristine Valdez is a very pleasant woman with PMH of CAD with unknown anatomy and stenting, HTN and very active life style. She can walk a few miles without difficulty, continues to mow her lawn and has done quite well. She saw Dr. Donnie Aho per patient approximately 1 month ago and things have been going well. She does not take aspirin because it causes her nose to bleed significantly but she has taken her plavix daily without difficulty along with her ARB and metoprolol. She woke up at 03:00 today with chest pressure - lasted approximately 15 minutes relieved with NTG and rest. Her son brought her to the hospital and she took another NTG SL and pain eased up. In ER, she has been chest pain free. She has no associated nausea/diaphoresis or weakness. She did also notice some pink sputum but no bleeding from her bottom or otherwise noted. She did not take aspirin because of her nose bleeding issues. She is currently chest pain free, troponin negative in the ER and fairly stable EKG from prior being brought in for observation.   Past Medical History  Diagnosis Date  . Hypertension   . Coronary artery disease   . CHF (congestive heart failure)     Past Surgical History  Procedure Date  . Carotid stent     No family history on file. Social History:  reports that she has never smoked. She does not have any smokeless tobacco history on file. She reports that she does not drink alcohol or use illicit drugs. No known CAD OR T2DM in her family Social history: active, lives at home, no drugs/tobacco or etoh  Allergies: No Known Allergies  Medications Prior to Admission  Medication Dose Route Frequency Provider Last Rate Last Dose  . aspirin chewable tablet 324 mg  324 mg Oral Once Scarlette Calico C. [place], [place]      . furosemide (LASIX) injection 40 mg  40 mg Intravenous Once April K Palumbo-Rasch, MD   40 mg at 12/02/11 1610   No current  outpatient prescriptions on file as of 12/02/2011.    Results for orders placed during the hospital encounter of 12/02/11 (from the past 48 hour(s))  CBC     Status: Normal   Collection Time   12/02/11  3:31 AM      Component Value Range Comment   WBC 7.1  4.0 - 10.5 (K/uL)    RBC 4.01  3.87 - 5.11 (MIL/uL)    Hemoglobin 13.3  12.0 - 15.0 (g/dL)    HCT 96.0  45.4 - 09.8 (%)    MCV 96.8  78.0 - 100.0 (fL)    MCH 33.2  26.0 - 34.0 (pg)    MCHC 34.3  30.0 - 36.0 (g/dL)    RDW 11.9  14.7 - 82.9 (%)    Platelets 213  150 - 400 (K/uL)   DIFFERENTIAL     Status: Abnormal   Collection Time   12/02/11  3:31 AM      Component Value Range Comment   Neutrophils Relative 34 (*) 43 - 77 (%)    Neutro Abs 2.4  1.7 - 7.7 (K/uL)    Lymphocytes Relative 53 (*) 12 - 46 (%)    Lymphs Abs 3.8  0.7 - 4.0 (K/uL)    Monocytes Relative 10  3 - 12 (%)    Monocytes Absolute 0.7  0.1 - 1.0 (K/uL)  Eosinophils Relative 3  0 - 5 (%)    Eosinophils Absolute 0.2  0.0 - 0.7 (K/uL)    Basophils Relative 0  0 - 1 (%)    Basophils Absolute 0.0  0.0 - 0.1 (K/uL)   BASIC METABOLIC PANEL     Status: Abnormal   Collection Time   12/02/11  3:31 AM      Component Value Range Comment   Sodium 140  135 - 145 (mEq/L)    Potassium 3.0 (*) 3.5 - 5.1 (mEq/L)    Chloride 102  96 - 112 (mEq/L)    CO2 25  19 - 32 (mEq/L)    Glucose, Bld 170 (*) 70 - 99 (mg/dL)    BUN 22  6 - 23 (mg/dL)    Creatinine, Ser 1.61  0.50 - 1.10 (mg/dL)    Calcium 9.7  8.4 - 10.5 (mg/dL)    GFR calc non Af Amer 48 (*) >90 (mL/min)    GFR calc Af Amer 56 (*) >90 (mL/min)   PROTIME-INR     Status: Normal   Collection Time   12/02/11  3:31 AM      Component Value Range Comment   Prothrombin Time 14.7  11.6 - 15.2 (seconds)    INR 1.13  0.00 - 1.49    POCT I-STAT TROPONIN I     Status: Normal   Collection Time   12/02/11  3:45 AM      Component Value Range Comment   Troponin i, poc 0.00  0.00 - 0.08 (ng/mL)    Comment 3             Dg Chest 2  View  12/02/2011  *RADIOLOGY REPORT*  Clinical Data: Chest pain started last night  CHEST - 2 VIEW  Comparison: 04/13/2010  Findings: Cardiac enlargement with increased pulmonary vascularity and interstitial changes suggesting mild interstitial edema.  No focal airspace consolidation.  No blunting of costophrenic angles. No pneumothorax.  Diffuse emphysematous changes.  Calcified and tortuous aorta.  Degenerative changes in the spine.  IMPRESSION: Mild congestive changes in the heart and lungs with interstitial changes suggesting mild edema.  Original Report Authenticated By: Marlon Pel, M.D.    Review of Systems  Constitutional: Negative for fever, chills and weight loss.  HENT: Negative for hearing loss, neck pain and tinnitus.   Eyes: Negative for blurred vision, double vision and photophobia.  Respiratory: Positive for cough and sputum production.        Pink sputum x 1 when she awoke today.  Cardiovascular: Positive for chest pain. Negative for palpitations, orthopnea and claudication.  Gastrointestinal: Negative for heartburn, nausea, vomiting, abdominal pain, blood in stool and melena.  Genitourinary: Negative for dysuria, urgency and frequency.  Musculoskeletal: Negative for myalgias and back pain.  Skin: Negative for itching and rash.  Neurological: Negative for dizziness, tingling, tremors and headaches.  Valdez/Heme/Allergies: Negative for environmental allergies. Bruises/bleeds easily.  Psychiatric/Behavioral: Negative for depression, suicidal ideas and substance abuse.    Blood pressure 186/94, pulse 92, temperature 98.1 F (36.7 C), temperature source Oral, resp. rate 16, SpO2 100.00%. Physical Exam  Nursing note and vitals reviewed. Constitutional: She is oriented to person, place, and time. She appears well-developed and well-nourished.  HENT:  Head: Normocephalic and atraumatic.  Nose: Nose normal.  Mouth/Throat: Oropharynx is clear and moist. No oropharyngeal  exudate.  Eyes: Conjunctivae and EOM are normal. Pupils are equal, round, and reactive to light. No scleral icterus.  Neck: Normal range of motion.  Neck supple. No JVD present. No tracheal deviation present. No thyromegaly present.  Cardiovascular: Normal rate, regular rhythm, normal heart sounds and intact distal pulses.  Exam reveals no gallop.   No murmur heard. Respiratory: Effort normal and breath sounds normal. No respiratory distress. She has no wheezes.  GI: Soft. Bowel sounds are normal. She exhibits no distension. There is no tenderness.  Musculoskeletal: Normal range of motion. She exhibits edema. She exhibits no tenderness.       Trace LEE bilaterally  Lymphadenopathy:    She has no cervical adenopathy.  Neurological: She is alert and oriented to person, place, and time. No cranial nerve deficit. Coordination normal.  Skin: Skin is warm and dry. No erythema.  Psychiatric: She has a normal mood and affect. Her behavior is normal.    Labs reviewed as above; troponin negative, INR 1, h/h stable, potassium 3.0 EKG reviewed; prior anterior and inferior infarcts on comparison - some old stable ST elevation  Problem list CP with prior stenting Pink sputum HTN Aspirin bleeding Hypokalemia  Assessment/Plan Ms. Steedman is very pleasant 78 yo woman with PMH of CAD and stenting, HTN being brought in for observation after 15 minutes of chest pain and frothy sputum.   Chest pain: trend troponins, EKG reviewed and stable, currently chest pain free - continue plavix, prefer aspirin as well but patient adverse; defer to Dr. Donnie Aho and patient discussion this AM - defer heparin with negative troponin and chest pain free - depending on timing of prior stenting, LHC/coronary angiography may be reasonable - place on telemetry  Pink sputum: ? CHF, unknown date of last TTE, no overt symptoms of CHF but would warrant Echo if not done previously - lasix given in ER and home lasix 40 mg PO daily  continued  Hypokalemia: repleted in ER, recheck BMP  HTN: continue home medications of ARB and metoprolol - would add hydralazine/nitrates if BP not at goal < 140/90  Kristine Must, MD  Kristine Valdez, Kristine Valdez 12/02/2011, 5:53 AM

## 2011-12-02 NOTE — ED Notes (Signed)
Respirations even & unlabored, no distress.  No voiced complaints presently. NAD.Awaiting bed assignment. Informed patient and/or family of status.

## 2011-12-02 NOTE — ED Provider Notes (Signed)
History     CSN: [id number]  Arrival date & time 12/02/11  9528   First MD Initiated Contact with Patient 12/02/11 0350      Chief Complaint  Patient presents with  . Chest Pain    (Consider location/radiation/quality/duration/timing/severity/associated sxs/prior treatment) Patient is a 78 y.o. female presenting with chest pain. The history is provided by the patient and a relative. No language interpreter was used.  Chest Pain The chest pain began 1 - 2 hours ago. Duration of episode(s) is 20 minutes. Chest pain occurs constantly. The chest pain is resolved. Associated with: nothing. At its most intense, the pain is at 6/10. The pain is currently at 0/10. The severity of the pain is moderate. The quality of the pain is described as pressure-like. The pain does not radiate. Exacerbated by: nothing. Primary symptoms include shortness of breath and cough. Pertinent negatives for primary symptoms include no nausea and no vomiting.  Associated symptoms include weakness.  Pertinent negatives for associated symptoms include no diaphoresis and no lower extremity edema. She tried nitroglycerin for the symptoms. Risk factors include being elderly and post-menopausal.  Her past medical history is significant for MI.  Pertinent negatives for family medical history include: no Marfan's syndrome in family.  Procedure history is positive for cardiac catheterization.     Past Medical History  Diagnosis Date  . Hypertension   . Coronary artery disease   . CHF (congestive heart failure)     Past Surgical History  Procedure Date  . Carotid stent     No family history on file.  History  Substance Use Topics  . Smoking status: Never Smoker   . Smokeless tobacco: Not on file  . Alcohol Use: No    OB History    Grav Para Term Preterm Abortions TAB SAB Ect Mult Living                  Review of Systems  Constitutional: Negative for diaphoresis.  HENT: Negative.   Eyes: Negative.     Respiratory: Positive for cough and shortness of breath.   Cardiovascular: Positive for chest pain.  Gastrointestinal: Negative for nausea and vomiting.  Genitourinary: Negative.   Musculoskeletal: Negative.   Skin: Negative.   Neurological: Positive for weakness.  Hematological: Negative.   Psychiatric/Behavioral: Negative.     Allergies  Review of patient's allergies indicates no known allergies.  Home Medications   Current Outpatient Rx  Name Route Sig Dispense Refill  . ATACAND PO Oral Take 1 tablet by mouth daily.    Marland Kitchen CLOPIDOGREL BISULFATE 75 MG PO TABS Oral Take 75 mg by mouth daily.    Marland Kitchen LASIX PO Oral Take 1 tablet by mouth daily.    Marland Kitchen METOPROLOL TARTRATE PO Oral Take 1 tablet by mouth daily.      BP 203/118  Pulse 88  Temp(Src) 97.9 F (36.6 C) (Oral)  Resp 16  SpO2 97%  Physical Exam  Constitutional: She is oriented to person, place, and time. She appears well-developed and well-nourished.  HENT:  Head: Normocephalic and atraumatic.  Mouth/Throat: Oropharynx is clear and moist.  Eyes: Conjunctivae are normal. Pupils are equal, round, and reactive to light.  Neck: Normal range of motion. Neck supple.  Cardiovascular: Normal rate and regular rhythm.   Pulmonary/Chest: Effort normal and breath sounds normal. No respiratory distress.  Abdominal: Soft. Bowel sounds are normal. There is no tenderness. There is no rebound and no guarding.  Musculoskeletal: Normal range of motion. She exhibits  no edema.  Neurological: She is alert and oriented to person, place, and time.  Skin: Skin is warm and dry. No pallor.  Psychiatric: She has a normal mood and affect.    ED Course  Procedures (including critical care time)  Labs Reviewed  DIFFERENTIAL - Abnormal; Notable for the following:    Neutrophils Relative 34 (*)    Lymphocytes Relative 53 (*)    All other components within normal limits  CBC  PROTIME-INR  POCT I-STAT TROPONIN I  BASIC METABOLIC PANEL   No  results found.   No diagnosis found.    MDM   Date: 12/02/2011  Rate: 97  Rhythm: normal sinus rhythm  QRS Axis: normal  Intervals: normal  ST/T Wave abnormalities: t wave inversions laterally  Conduction Disutrbances:none  Narrative Interpretation:   Old EKG Reviewed: changes noted         Kerin Cecchi Smitty Cords, MD 12/02/11 [phone]

## 2011-12-02 NOTE — ED Notes (Signed)
PT. REPORTS SUBSTERNAL CHEST PAIN WITH PALPITATIONS ONSET THIS MORNING ( 2 AM ) , SLIGHT SOB , DENIES COUGH , DENIES NAUSEA OR DIAPHORESIS , SLIGHT HEADACHE.

## 2011-12-02 NOTE — ED Notes (Signed)
Dr. Donnie Aho notified & aware of elevated troponin. Pt with no c/o CP

## 2011-12-02 NOTE — Op Note (Signed)
Cardiac Catheterization Report   Kristine Valdez  1926/03/17  [id number]  12/02/2011   PROCEDURE:  Left heart catheterization with selective coronary angiography, left ventriculogram.  INDICATIONS: Non-ST elevation myocardial infarction.  The risks, benefits, and details of the procedure were explained to the patient.  The patient verbalized understanding and wanted to proceed.  Informed written consent was obtained.  PROCEDURE TECHNIQUE:  After Xylocaine anesthesia a 32F sheath was placed in the right femoral artery with a single anterior needle wall stick.   Left coronary angiography was done using a Judkins L4 guide catheter.  Right coronary angiography was done using a Judkins R4 guide catheter.  A 30 cc ventriculogram was performed. The patient was given 20 mg of labetalol intravenously for elevation of blood pressure and was taken to the holding area for sheath removal.   CONTRAST:  Total of 70 cc.  COMPLICATIONS:  None.    HEMODYNAMICS:  Aortic postcontrast: 156/73 LV postcontrast 156/1-4.  There was no gradient between the left ventricle and aorta.    ANGIOGRAPHIC DATA:    CORONARY ARTERIES:   Arise and distribute normally.  Left dominant. Mild coronary calcification is noted.  Left main coronary artery: Somewhat short but contains no significant stenosis.  Left anterior descending: Previous stent is present in the mid vessel with a 50% tubular in-stent restenosis. The first diagonal branch arises within the stent and has an ostial 95-99% stenosis. The distal vessel extends around the apex.  Circumflex coronary artery: Dominant vessel which is significant tortuous proximally. The vessel appears smooth and is a dominant vessel and contains no significant stenosis..  Right coronary artery: Calcium present at the ostium. The vessel supplies mainly right ventricular branches and is free of disease.Marland Kitchen  LEFT VENTRICULOGRAM:  Performed in the 30  RAO projection.  The aortic and mitral valves are normal. The left ventricle is mildly dilated. There is an extensive area of anteroapical akinesis with mild apical dyskinesis noted. The distal inferior wall is also akinetic. Yesterday ejection fraction is 30%.  IMPRESSIONS:  1.  patent LAD with a moderate area of restenosis. Severe stenosis involving a diagonal branch which arises from within the stent. Minimal disease elsewhere 2. Abnormal LV function with anteroapical akinesis and an EF of 30%  RECOMMENDATION:  the patient has mild elevations of troponin which may be due to heart failure or do to a small non-ST elevation infarction. She will be continued on Plavix and aspirin and had good blood pressure control. I feel the risks of intervention on a ostial diagonal stenosis arising from within the stent exceed the benefits.Darden Palmer MD [facility]

## 2011-12-02 NOTE — ED Notes (Signed)
Report received, assumed care.  

## 2011-12-02 NOTE — ED Notes (Signed)
To cath lab.

## 2011-12-02 NOTE — ED Notes (Signed)
Attempted to call report, nurse unavailable.

## 2011-12-03 ENCOUNTER — Other Ambulatory Visit: Payer: Self-pay

## 2011-12-03 DIAGNOSIS — I509 Heart failure, unspecified: Secondary | ICD-10-CM

## 2011-12-03 LAB — BASIC METABOLIC PANEL
Calcium: 10.2 mg/dL (ref 8.4–10.5)
Chloride: 101 mEq/L (ref 96–112)
Creatinine, Ser: 1.24 mg/dL — ABNORMAL HIGH (ref 0.50–1.10)
GFR calc Af Amer: 45 mL/min — ABNORMAL LOW (ref >90)

## 2011-12-03 MED ORDER — ASPIRIN 81 MG PO TBEC: 81.0000 mg | DELAYED_RELEASE_TABLET | Freq: Every day | ORAL | Status: AC

## 2011-12-03 MED ORDER — NITROGLYCERIN 0.4 MG SL SUBL: 0.4000 mg | SUBLINGUAL_TABLET | SUBLINGUAL | Status: DC | PRN

## 2011-12-03 MED ORDER — METOPROLOL TARTRATE 100 MG PO TABS: 50.0000 mg | ORAL_TABLET | Freq: Two times a day (BID) | ORAL | Status: DC

## 2011-12-03 NOTE — Progress Notes (Signed)
[employer] student, Landry Dyke, discontinued NSL x 2. Myrle Sheng, RN witnessed.

## 2011-12-03 NOTE — Discharge Summary (Signed)
Physician Discharge Summary  Patient ID: Kristine Valdez MRN: [id number] DOB/AGE: 76-Oct-1927 76 y.o.  Admit date: 12/02/2011 Discharge date: 12/03/2011  Primary Physician: Dr. Windle Guard Primary Discharge Diagnosis: 1. Acute systolic congestive heart failure on top of chronic  Secondary Discharge Diagnosis: 2. Hypertensive heart disease 3. Coronary artery disease with previous anterior infarction 4. Hyperlipidemia and treated with significant statin intolerance 5. Diet-controlled diabetes mellitus 6. Unstable angina pectoris  Procedures::  Cardiac catheterization  Hospital Course: Patient admitted to the hospital because of significant dyspnea that she awoke with with mild chest pressure. She saw Dr. Donnie Aho per patient approximately 1 month ago and things have been going well. She does not take aspirin because it causes her nose to bleed significantly but she has taken her plavix daily without difficulty along with her ARB and metoprolol. She woke up at 03:00 today with chest pressure - lasted approximately 15 minutes relieved with NTG and rest. Her son brought her to the hospital and she took another NTG SL and pain eased up. In ER, she has been chest pain free. She has no associated nausea/diaphoresis or weakness. She did also notice some pink sputum but no bleeding from her bottom or otherwise noted. She did not take aspirin because of her nose bleeding issues. She admitted to having missed her dose of furosemide the morning of admission. She was significantly hypertensive on admission that improved.  The patient was given a single dose of furosemide. Serial troponins showed a mild elevation. Troponin with normal CPK MB. She was placed on Plavix and aspirin and was taken to the cardiac catheterization laboratory. She had an ejection fraction of 30-35%. Her BNP level was elevated. She diuresed about 4 pounds. Her catheterization she was found to have a stenosis of the first diagonal branch  estimated at 90% but it was a jailed diagonal and not felt to be suitable for intervention will be treated medically. The previous stented site had a 50% in-stent restenosis but was felt to be stable. The circumflex was dominant without significant disease. She had no recurrent chest pain and her breathing was fine the next day.  She was ambulatory in the hall without recurrent chest discomfort and will be discharged home in improved condition. It is postulated that she either had excess fluid from missing her furosemide dose  Or potentially had an episode of ischemia from the diagonal branch. She'll be discharged home she has no recurrent chest pain.  Discharge Exam: Blood pressure 105/57, pulse 105, temperature 97.7 F (36.5 C), temperature source Oral, resp. rate 18, height 5\' 7"  (1.702 m), weight 54.069 kg (119 lb 3.2 oz), SpO2 99.00%.    Catheterization site is clean and dry without ecchymoses. Lungs are clear. There was no murmur.  Labs: CBC:   Lab Results  Component Value Date   WBC 5.3 12/02/2011   HGB 14.1 12/02/2011   HCT 40.9 12/02/2011   MCV 96.5 12/02/2011   PLT 197 12/02/2011   CMP:  Lab 12/03/11 0615 12/02/11 0934  NA 138 --  K 4.2 --  CL 101 --  CO2 27 --  BUN 18 --  CREATININE 1.24* --  CALCIUM 10.2 --  PROT -- 8.6*  BILITOT -- 0.5  ALKPHOS -- 82  ALT -- 12  AST -- 21  GLUCOSE 122* --   Lipid Panel     Component Value Date/Time   CHOL 287* 12/02/2011 0940   TRIG 68 12/02/2011 0940   HDL 109 12/02/2011 0940  CHOLHDL 2.6 12/02/2011 0940   VLDL 14 12/02/2011 0940   LDLCALC 164* 12/02/2011 0940   Cardiac Enzymes:  Basename 12/02/11 2122 12/02/11 1521 12/02/11 0940  CKTOTAL 81 81 75  CKMB 4.5* 5.3* 5.4*  CKMBINDEX -- -- --  TROPONINI 0.45* 0.65* 0.77*    Radiology: Increased interstitial markings, mild cardiomegaly  EKG: Previous anterior infarction, new T-wave changes noted in the anterolateral leads. ST depression present on admission that resolved.  Discharge  Medications:  Kristine Valdez, Kristine Valdez  Home Medication Instructions ZOX:[id number]   Printed on:12/03/11 0910  Medication Information                    clopidogrel (PLAVIX) 75 MG tablet Take 75 mg by mouth daily.           candesartan (ATACAND) 16 MG tablet Take 16 mg by mouth daily.           furosemide (LASIX) 40 MG tablet Take 40 mg by mouth daily.           metoprolol (LOPRESSOR) 100 MG tablet Take 0.5 tablets (50 mg total) by mouth 2 (two) times daily.             Followup plans and appointments: Catheterization instructions were given. She previously had a significant headache on nitroglycerin. We will send her home on as needed nitroglycerin aspirin also take aspirin 81 mg daily in addition to the medications above. She is to divide her metoprolol dose to twice daily. She is to weigh daily and report increased weight gain. Followup with me in one week.  Signed: Darden Palmer. MD [facility] 12/03/2011, 9:10 AM

## 2012-08-22 DIAGNOSIS — H40019 Open angle with borderline findings, low risk, unspecified eye: Secondary | ICD-10-CM

## 2012-08-22 DIAGNOSIS — IMO0002 Reserved for concepts with insufficient information to code with codable children: Secondary | ICD-10-CM

## 2012-08-22 HISTORY — DX: Open angle with borderline findings, low risk, unspecified eye: H40.019

## 2012-08-22 HISTORY — DX: Reserved for concepts with insufficient information to code with codable children: IMO0002

## 2014-04-03 DIAGNOSIS — H43811 Vitreous degeneration, right eye: Secondary | ICD-10-CM | POA: Insufficient documentation

## 2014-04-03 HISTORY — DX: Vitreous degeneration, right eye: H43.811

## 2014-10-09 ENCOUNTER — Encounter (HOSPITAL_COMMUNITY): Payer: Self-pay | Admitting: Cardiology

## 2015-12-01 DIAGNOSIS — H35371 Puckering of macula, right eye: Secondary | ICD-10-CM

## 2015-12-01 DIAGNOSIS — H40013 Open angle with borderline findings, low risk, bilateral: Secondary | ICD-10-CM

## 2015-12-01 HISTORY — DX: Open angle with borderline findings, low risk, bilateral: H40.013

## 2015-12-01 HISTORY — DX: Puckering of macula, right eye: H35.371

## 2016-11-21 ENCOUNTER — Other Ambulatory Visit: Payer: Self-pay | Admitting: Family Medicine

## 2016-11-21 DIAGNOSIS — M5430 Sciatica, unspecified side: Secondary | ICD-10-CM

## 2016-11-27 ENCOUNTER — Ambulatory Visit
Admission: RE | Admit: 2016-11-27 | Discharge: 2016-11-27 | Disposition: A | Discharge status: Home / Self Care | Payer: Medicare Other | Source: Ambulatory Visit | Attending: Family Medicine | Admitting: Family Medicine

## 2016-11-27 DIAGNOSIS — M5430 Sciatica, unspecified side: Secondary | ICD-10-CM

## 2017-04-16 ENCOUNTER — Emergency Department (HOSPITAL_COMMUNITY)
Admission: EM | Admit: 2017-04-16 | Discharge: 2017-04-16 | Disposition: A | Discharge status: Home / Self Care | Payer: Medicare Other | Attending: Emergency Medicine | Admitting: Emergency Medicine

## 2017-04-16 ENCOUNTER — Encounter (HOSPITAL_COMMUNITY): Payer: Self-pay | Admitting: Emergency Medicine

## 2017-04-16 DIAGNOSIS — I110 Hypertensive heart disease with heart failure: Secondary | ICD-10-CM | POA: Diagnosis not present

## 2017-04-16 DIAGNOSIS — I5023 Acute on chronic systolic (congestive) heart failure: Secondary | ICD-10-CM | POA: Insufficient documentation

## 2017-04-16 DIAGNOSIS — L03116 Cellulitis of left lower limb: Secondary | ICD-10-CM | POA: Diagnosis not present

## 2017-04-16 DIAGNOSIS — I252 Old myocardial infarction: Secondary | ICD-10-CM | POA: Diagnosis not present

## 2017-04-16 DIAGNOSIS — I2510 Atherosclerotic heart disease of native coronary artery without angina pectoris: Secondary | ICD-10-CM | POA: Insufficient documentation

## 2017-04-16 DIAGNOSIS — Z79899 Other long term (current) drug therapy: Secondary | ICD-10-CM | POA: Insufficient documentation

## 2017-04-16 DIAGNOSIS — M79672 Pain in left foot: Secondary | ICD-10-CM | POA: Diagnosis present

## 2017-04-16 LAB — BASIC METABOLIC PANEL
Anion gap: 8 (ref 5–15)
BUN: 15 mg/dL (ref 6–20)
CHLORIDE: 104 mmol/L (ref 101–111)
CO2: 25 mmol/L (ref 22–32)
CREATININE: 1.09 mg/dL — AB (ref 0.44–1.00)
Calcium: 10.2 mg/dL (ref 8.9–10.3)
GFR calc Af Amer: 51 mL/min — ABNORMAL LOW (ref >60)
GFR calc non Af Amer: 44 mL/min — ABNORMAL LOW (ref >60)
Glucose, Bld: 108 mg/dL — ABNORMAL HIGH (ref 65–99)
POTASSIUM: 4.4 mmol/L (ref 3.5–5.1)
Sodium: 137 mmol/L (ref 135–145)

## 2017-04-16 LAB — CBC WITH DIFFERENTIAL/PLATELET
Basophils Absolute: 0 10*3/uL (ref 0.0–0.1)
Basophils Relative: 0 %
Eosinophils Absolute: 0 10*3/uL (ref 0.0–0.7)
Eosinophils Relative: 0 %
HEMATOCRIT: 40.4 % (ref 36.0–46.0)
HEMOGLOBIN: 13.8 g/dL (ref 12.0–15.0)
LYMPHS ABS: 1.6 10*3/uL (ref 0.7–4.0)
LYMPHS PCT: 22 %
MCH: 33.2 pg (ref 26.0–34.0)
MCHC: 34.2 g/dL (ref 30.0–36.0)
MCV: 97.1 fL (ref 78.0–100.0)
MONOS PCT: 9 %
Monocytes Absolute: 0.7 10*3/uL (ref 0.1–1.0)
NEUTROS ABS: 4.9 10*3/uL (ref 1.7–7.7)
NEUTROS PCT: 69 %
Platelets: 241 10*3/uL (ref 150–400)
RBC: 4.16 MIL/uL (ref 3.87–5.11)
RDW: 13.3 % (ref 11.5–15.5)
WBC: 7.2 10*3/uL (ref 4.0–10.5)

## 2017-04-16 LAB — I-STAT CG4 LACTIC ACID, ED: Lactic Acid, Venous: 1.16 mmol/L (ref 0.5–1.9)

## 2017-04-16 MED ORDER — CEPHALEXIN 500 MG PO CAPS: 500.0000 mg | ORAL_CAPSULE | Freq: Four times a day (QID) | ORAL | 0 refills | Status: DC

## 2017-04-16 MED ORDER — HYDROCODONE-ACETAMINOPHEN 5-325 MG PO TABS: ORAL_TABLET | ORAL | 0 refills | Status: DC

## 2017-04-16 MED ORDER — SODIUM CHLORIDE 0.9 % IV SOLN
INTRAVENOUS | Status: DC
  Administered 2017-04-16: 11:00:00 via INTRAVENOUS

## 2017-04-16 MED ORDER — HYDROCODONE-ACETAMINOPHEN 5-325 MG PO TABS
1.0000 | ORAL_TABLET | Freq: Once | ORAL | Status: AC
  Administered 2017-04-16: 1 via ORAL
  Filled 2017-04-16: qty 1

## 2017-04-16 MED ORDER — CLINDAMYCIN PHOSPHATE 600 MG/50ML IV SOLN
600.0000 mg | Freq: Once | INTRAVENOUS | Status: AC
  Administered 2017-04-16: 600 mg via INTRAVENOUS
  Filled 2017-04-16: qty 50

## 2017-04-16 NOTE — Discharge Instructions (Signed)
Rest, Ice intermittently (in the first 24-48 hours), Gentle compression with an Ace wrap, and elevate (Limb above the level of the heart)   If you see signs of worsening infection (warmth, redness, tenderness, pus, sharp increase in pain, fever, red streaking) immediately return to the emergency department.  Please be very careful not to fall! The pain medication puts you at risk for falls. Please rest as much as possible and try to not stay alone.

## 2017-04-16 NOTE — ED Provider Notes (Signed)
[facility]PT Provider Note   CSN: [id number] Arrival date & time: 04/16/17  0815     History   Chief Complaint Chief Complaint  Patient presents with  . Foot Pain  . Insect Bite     HPI   Blood pressure (!) 176/88, pulse 98, temperature 98.4 F (36.9 C), temperature source Oral, height 5\' 7"  (1.702 m), weight 55.8 kg (123 lb), SpO2 96 %.  Kristine Valdez is a 83 y.o. female complaining of who presents to the ED today c/o gradual onset of left foot pain for the past week. Last Sunday 04/09/17, the patient went out for a walk and noticed a "pimple" on her dorsal foot that appeared to be 2/2 an insect bite, which eventually self-resolved. Then, 1-2 days ago she noticed redness, pain, and warmth to the dorsal aspect of her left foot. The pain is significant currently and is worse with pressure and weight bearing. Patient notes that her pain was so bad last night that "even the sheets hurt" her foot. Associated sx include decreased appetite since yesterday and some mild feelings of anxiety today. Denies trauma, injury, fever, chills. Denies history of gout. Her BP is elevated in the ED upon arrival, and she states that she did not take her medications this morning. No other complaints or concerns reported at this time.   Past Medical History:  Diagnosis Date  . CHF (congestive heart failure) (HCC)   . Chronic systolic congestive heart failure, NYHA class 2 (HCC)   . Coronary artery disease   . Hyperlipidemia   . Hypertension   . Hypertensive heart disease without CHF   . Old anterior myocardial infarction     Patient Active Problem List   Diagnosis Date Noted  . CHF (congestive heart failure) (HCC) 12/03/2011  . CAD (coronary artery disease)   . Hypertensive heart disease without CHF   . Hyperlipidemia   . Old anterior myocardial infarction   . Chronic systolic congestive heart failure, NYHA class 2 (HCC)   . Acute on chronic systolic congestive heart failure (HCC)  12/01/2011    Past Surgical History:  Procedure Laterality Date  . ABDOMINAL HYSTERECTOMY    . LEFT HEART CATHETERIZATION WITH CORONARY ANGIOGRAM N/A 12/02/2011   Procedure: LEFT HEART CATHETERIZATION WITH CORONARY ANGIOGRAM;  Surgeon: Othella BoyerWilliam S Tilley, MD;  [facility];  Service: Cardiovascular;  Laterality: N/A;    OB History    No data available       Home Medications    Prior to Admission medications   Medication Sig Start Date End Date Taking? Authorizing Provider  acetaminophen (TYLENOL) 325 MG tablet Take 650 mg by mouth every 6 (six) hours as needed for mild pain.   Yes [provider]  candesartan (ATACAND) 16 MG tablet Take 16 mg by mouth daily.   Yes [provider]  clopidogrel (PLAVIX) 75 MG tablet Take 75 mg by mouth daily.   Yes [provider]  furosemide (LASIX) 40 MG tablet Take 40 mg by mouth daily.   Yes [provider]  metoprolol (LOPRESSOR) 100 MG tablet Take 0.5 tablets (50 mg total) by mouth 2 (two) times daily. Patient taking differently: Take 50 mg by mouth daily.  12/03/11  Yes Othella Boyerilley, William S, MD  nitroGLYCERIN (NITROSTAT) 0.4 MG SL tablet Place 1 tablet (0.4 mg total) under the tongue every 5 (five) minutes x 3 doses as needed for chest pain. 12/03/11 04/16/17 Yes Othella Boyerilley, William S, MD  cephALEXin [facility](KEFLEX) 500  MG capsule Take 1 capsule (500 mg total) by mouth 4 (four) times daily. 04/16/17   Milinda Sweeney, Joni Reining, PA-C  HYDROcodone-acetaminophen (NORCO/VICODIN) 5-325 MG tablet Take one half tablet every 6 hours as needed for pain control 04/16/17   Anajah Sterbenz, Joni Reining, PA-C    Family History Family History  Problem Relation Age of Onset  . Heart attack Father   . Coronary artery disease Brother        History of CABG  . Cancer Brother   . Heart failure Mother   . Breast cancer Sister     Social History Social History  Substance Use Topics  . Smoking status: Never Smoker  . Smokeless tobacco: Never Used  .  Alcohol use No     Allergies   Cortisone; Penicillins; Statins; Ace inhibitors; Codeine; and Hydrocodone-acetaminophen   Review of Systems Review of Systems  A complete review of systems was obtained and all systems are negative except as noted in the HPI and PMH.    Physical Exam Updated Vital Signs BP (!) 146/71   Pulse 67   Temp 98.4 F (36.9 C) (Oral)   Ht 5\' 7"  (1.702 m)   Wt 55.8 kg (123 lb)   SpO2 99%   BMI 19.26 kg/m   Physical Exam  Constitutional: She is oriented to person, place, and time. She appears well-developed and well-nourished. No distress.  HENT:  Head: Normocephalic and atraumatic.  Mouth/Throat: Oropharynx is clear and moist.  Eyes: Conjunctivae and EOM are normal. Pupils are equal, round, and reactive to light.  Neck: Normal range of motion.  Cardiovascular: Regular rhythm and intact distal pulses.   Tachycardic but regular  Pulmonary/Chest: Effort normal and breath sounds normal.  Abdominal: Soft. There is no tenderness.  Musculoskeletal: Normal range of motion.  Neurological: She is alert and oriented to person, place, and time.  Skin: She is not diaphoretic. There is erythema.  6 cm area of induration erythema warmth and tenderness to the dorsum of the left foot, it excludes the toes. Skin is intact. No focal fluctuance.  Psychiatric: She has a normal mood and affect.  Nursing note and vitals reviewed.    ED Treatments / Results  Labs (all labs ordered are listed, but only abnormal results are displayed) Labs Reviewed  BASIC METABOLIC PANEL - Abnormal; Notable for the following:       Result Value   Glucose, Bld 108 (*)    Creatinine, Ser 1.09 (*)    GFR calc non Af Amer 44 (*)    GFR calc Af Amer 51 (*)    All other components within normal limits  CBC WITH DIFFERENTIAL/PLATELET  I-STAT CG4 LACTIC ACID, ED  I-STAT CG4 LACTIC ACID, ED    EKG  EKG Interpretation None       Radiology No results  found.  Procedures Procedures (including critical care time)  Medications Ordered in ED Medications  0.9 %  sodium chloride infusion ( Intravenous New Bag/Given 04/16/17 1117)  HYDROcodone-acetaminophen (NORCO/VICODIN) 5-325 MG per tablet 1 tablet (1 tablet Oral Given 04/16/17 1111)  clindamycin (CLEOCIN) IVPB 600 mg (0 mg Intravenous Stopped 04/16/17 1147)     Initial Impression / Assessment and Plan / ED Course  I have reviewed the triage vital signs and the nursing notes.  Pertinent labs & imaging results that were available during my care of the patient were reviewed by me and considered in my medical decision making (see chart for details).     Vitals:   04/16/17  1145 04/16/17 1215 04/16/17 1245 04/16/17 1315  BP: (!) 176/88 (!) 148/78 (!) 128/56 (!) 146/71  Pulse: 98 73 70 67  Temp:      TempSrc:      SpO2: 96% 96% 95% 99%  Weight:      Height:        Medications  0.9 %  sodium chloride infusion ( Intravenous New Bag/Given 04/16/17 1117)  HYDROcodone-acetaminophen (NORCO/VICODIN) 5-325 MG per tablet 1 tablet (1 tablet Oral Given 04/16/17 1111)  clindamycin (CLEOCIN) IVPB 600 mg (0 mg Intravenous Stopped 04/16/17 1147)    Kristine Valdez is 83 y.o. female presenting with Foot pain redness and erythema worsening over the course of the last week. Patient is initially very tachycardic however, I think this is likely secondary to nervousness and she also hasn't eaten this morning. She does not appear septic. Tachycardia has resolved after fluids and pain medication. Blood work reassuring with no white count normal lactic acid level. Patient will be given prescription for Keflex and Vicodin. We've had an extensive discussion of return precautions and fall precautions. Patient and her family verbalized understanding.  Evaluation does not show pathology that would require ongoing emergent intervention or inpatient treatment. Pt is hemodynamically stable and mentating appropriately.  Discussed findings and plan with patient/guardian, who agrees with care plan. All questions answered. Return precautions discussed and outpatient follow up given.      Final Clinical Impressions(s) / ED Diagnoses   Final diagnoses:  Cellulitis of left lower extremity    New Prescriptions New Prescriptions   CEPHALEXIN (KEFLEX) 500 MG CAPSULE    Take 1 capsule (500 mg total) by mouth 4 (four) times daily.   HYDROCODONE-ACETAMINOPHEN (NORCO/VICODIN) 5-325 MG TABLET    Take one half tablet every 6 hours as needed for pain control     Osten Janek, Mardella Layman 04/16/17 1324    Rolan Bucco, MD 04/16/17 1337

## 2017-04-16 NOTE — ED Triage Notes (Signed)
Pt. Stated, something bit me on my left foot in the upper area around my toes and on the bottom. It feels tight and swollen.

## 2017-04-16 NOTE — ED Notes (Signed)
PA student at bedside.

## 2017-05-02 ENCOUNTER — Emergency Department (HOSPITAL_COMMUNITY)
Admission: EM | Admit: 2017-05-02 | Discharge: 2017-05-02 | Disposition: A | Discharge status: Home / Self Care | Payer: Medicare Other | Attending: Emergency Medicine | Admitting: Emergency Medicine

## 2017-05-02 ENCOUNTER — Encounter (HOSPITAL_COMMUNITY): Payer: Self-pay | Admitting: Emergency Medicine

## 2017-05-02 DIAGNOSIS — I10 Essential (primary) hypertension: Secondary | ICD-10-CM | POA: Insufficient documentation

## 2017-05-02 DIAGNOSIS — K5900 Constipation, unspecified: Secondary | ICD-10-CM

## 2017-05-02 DIAGNOSIS — I509 Heart failure, unspecified: Secondary | ICD-10-CM | POA: Insufficient documentation

## 2017-05-02 DIAGNOSIS — Z79899 Other long term (current) drug therapy: Secondary | ICD-10-CM | POA: Diagnosis not present

## 2017-05-02 DIAGNOSIS — I2510 Atherosclerotic heart disease of native coronary artery without angina pectoris: Secondary | ICD-10-CM | POA: Insufficient documentation

## 2017-05-02 DIAGNOSIS — K6289 Other specified diseases of anus and rectum: Secondary | ICD-10-CM | POA: Diagnosis present

## 2017-05-02 DIAGNOSIS — K5909 Other constipation: Secondary | ICD-10-CM | POA: Diagnosis not present

## 2017-05-02 MED ORDER — BISACODYL 10 MG RE SUPP
10.0000 mg | Freq: Once | RECTAL | Status: DC
  Filled 2017-05-02: qty 1

## 2017-05-02 MED ORDER — FLEET ENEMA 7-19 GM/118ML RE ENEM
1.0000 | ENEMA | Freq: Once | RECTAL | Status: AC
  Administered 2017-05-02: 1 via RECTAL
  Filled 2017-05-02: qty 1

## 2017-05-02 NOTE — ED Triage Notes (Signed)
Per [facility], Pt from home. Pt complaining of rectal pain, feels like she needs to have a BM but can't. Pt had a normal BM yesterday, she noted no blood in stool. Pt denies abdominal pain.

## 2017-05-02 NOTE — ED Notes (Addendum)
Pt was able to have a small bowel movement. Pt feels a little better. Pt does not want the suppository at this time.

## 2017-05-02 NOTE — ED Notes (Signed)
Pt ambulating in the hall with family member, states, she feels much better.

## 2017-05-02 NOTE — Discharge Instructions (Signed)
Drink plenty of fluids. Take a fiber supplement daily. You may use Colace daily and MiraLAX as needed for soft bowel movements.

## 2017-05-02 NOTE — ED Provider Notes (Signed)
[facility] Provider Note   CSN: [id number] Arrival date & time: 05/02/17  1549     History   Chief Complaint Chief Complaint  Patient presents with  . Rectal Pain    HPI DAE ANTONUCCI is a 83 y.o. female.  83yo F w/ PMH below including CHF, CAD, HTN who p/w rectal pain. Patient started having intermittent rectal pain that has been coming and going all day in episodes lasting 12-15 minutes. At times the pain becomes severe. She states that she feels like she is to have a bowel movement and has tried to go several times but has been unsuccessful. Last bowel movement was yesterday and was normal. She denies any recent constipation. No black or bloody stools. No urinary symptoms. She denies any associated abdominal pain, fevers, nausea, vomiting, diarrhea, or recent illness.   The history is provided by the patient.    Past Medical History:  Diagnosis Date  . CHF (congestive heart failure) (HCC)   . Chronic systolic congestive heart failure, NYHA class 2 (HCC)   . Coronary artery disease   . Hyperlipidemia   . Hypertension   . Hypertensive heart disease without CHF   . Old anterior myocardial infarction     Patient Active Problem List   Diagnosis Date Noted  . CHF (congestive heart failure) (HCC) 12/03/2011  . CAD (coronary artery disease)   . Hypertensive heart disease without CHF   . Hyperlipidemia   . Old anterior myocardial infarction   . Chronic systolic congestive heart failure, NYHA class 2 (HCC)   . Acute on chronic systolic congestive heart failure (HCC) 12/01/2011    Past Surgical History:  Procedure Laterality Date  . ABDOMINAL HYSTERECTOMY    . LEFT HEART CATHETERIZATION WITH CORONARY ANGIOGRAM N/A 12/02/2011   Procedure: LEFT HEART CATHETERIZATION WITH CORONARY ANGIOGRAM;  Surgeon: Othella Boyer, MD;  Location: [facility] CATH LAB;  Service: Cardiovascular;  Laterality: N/A;    OB History    No data available       Home Medications    Prior to  Admission medications   Medication Sig Start Date End Date Taking? Authorizing Provider  candesartan (ATACAND) 16 MG tablet Take 16 mg by mouth daily.   Yes [provider]  cephALEXin (KEFLEX) 500 MG capsule Take 1 capsule (500 mg total) by mouth 4 (four) times daily. 04/16/17  Yes Pisciotta, Joni Reining, PA-C  clopidogrel (PLAVIX) 75 MG tablet Take 75 mg by mouth daily.   Yes [provider]  furosemide (LASIX) 40 MG tablet Take 40 mg by mouth daily.   Yes [provider]  metoprolol (LOPRESSOR) 100 MG tablet Take 0.5 tablets (50 mg total) by mouth 2 (two) times daily. Patient taking differently: Take 50 mg by mouth daily.  12/03/11  Yes Othella Boyer, MD  acetaminophen (TYLENOL) 325 MG tablet Take 650 mg by mouth every 6 (six) hours as needed for mild pain.    [provider]  HYDROcodone-acetaminophen (NORCO/VICODIN) 5-325 MG tablet Take one half tablet every 6 hours as needed for pain control 04/16/17   Pisciotta, Joni Reining, PA-C  nitroGLYCERIN (NITROSTAT) 0.4 MG SL tablet Place 1 tablet (0.4 mg total) under the tongue every 5 (five) minutes x 3 doses as needed for chest pain. 12/03/11 04/16/17  Othella Boyer, MD    Family History Family History  Problem Relation Age of Onset  . Heart attack Father   . Coronary artery disease Brother        History  of CABG  . Cancer Brother   . Heart failure Mother   . Breast cancer Sister     Social History Social History  Substance Use Topics  . Smoking status: Never Smoker  . Smokeless tobacco: Never Used  . Alcohol use No     Allergies   Cortisone; Penicillins; Statins; Ace inhibitors; Codeine; and Hydrocodone-acetaminophen   Review of Systems Review of Systems All other systems reviewed and are negative except that which was mentioned in HPI   Physical Exam Updated Vital Signs SpO2 98%   Physical Exam  Constitutional: She is oriented to person, place, and time. She appears well-developed and  well-nourished. No distress.  HENT:  Head: Normocephalic and atraumatic.  Mouth/Throat: Oropharynx is clear and moist.  Moist mucous membranes  Eyes: Conjunctivae are normal. Pupils are equal, round, and reactive to light.  Neck: Neck supple.  Cardiovascular: Normal rate, regular rhythm and normal heart sounds.   No murmur heard. Pulmonary/Chest: Effort normal and breath sounds normal.  Abdominal: Soft. Bowel sounds are normal. She exhibits no distension. There is no tenderness.  Genitourinary:  Genitourinary Comments: Chaperone was present during exam. Large amount of hard brown stool low in rectal vault  Musculoskeletal: She exhibits no edema.  Neurological: She is alert and oriented to person, place, and time.  Fluent speech  Skin: Skin is warm and dry.  Psychiatric: She has a normal mood and affect. Judgment normal.  Nursing note and vitals reviewed.    ED Treatments / Results  Labs (all labs ordered are listed, but only abnormal results are displayed) Labs Reviewed - No data to display  EKG  EKG Interpretation None       Radiology No results found.  Procedures Procedures (including critical care time)  Medications Ordered in ED Medications  bisacodyl (DULCOLAX) suppository 10 mg (10 mg Rectal Refused 05/02/17 1830)  sodium phosphate (FLEET) 7-19 GM/118ML enema 1 enema (1 enema Rectal Given 05/02/17 1733)     Initial Impression / Assessment and Plan / ED Course  I have reviewed the triage vital signs and the nursing notes.    PT w/ intermittent rectal pain today, large amount of stool in rectal vault on exam. Gave fleets enema And patient later able to have a bowel movement. She stated that she felt better afterwards and was well appearing and sitting up on repeat exam. She has never had any abdominal pain. Given no other complaints and feels she is safe for discharge. I have discussed supportive measures for constipation including plenty of fluids, Colace, and  MiraLAX. Patient and family voiced understanding and she was discharged in satisfactory condition.  Final Clinical Impressions(s) / ED Diagnoses   Final diagnoses:  Constipation, unspecified constipation type    New Prescriptions New Prescriptions   No medications on file     Emalene Welte, Ambrose Finlandachel Morgan, MD 05/02/17 [id number]

## 2017-05-11 ENCOUNTER — Other Ambulatory Visit: Payer: Self-pay | Admitting: Cardiology

## 2017-05-11 DIAGNOSIS — I6529 Occlusion and stenosis of unspecified carotid artery: Secondary | ICD-10-CM

## 2017-05-12 ENCOUNTER — Ambulatory Visit (HOSPITAL_COMMUNITY)
Admission: RE | Admit: 2017-05-12 | Discharge: 2017-05-12 | Disposition: A | Discharge status: Home / Self Care | Payer: Medicare Other | Source: Ambulatory Visit | Attending: Vascular Surgery | Admitting: Vascular Surgery

## 2017-05-12 DIAGNOSIS — I6529 Occlusion and stenosis of unspecified carotid artery: Secondary | ICD-10-CM

## 2017-05-12 DIAGNOSIS — I6521 Occlusion and stenosis of right carotid artery: Secondary | ICD-10-CM | POA: Diagnosis not present

## 2017-05-12 LAB — VAS US CAROTID
LCCADDIAS: 11 cm/s
LCCADSYS: 65 cm/s
LEFT ECA DIAS: 0 cm/s
LICADSYS: -56 cm/s
LICAPSYS: 108 cm/s
Left CCA prox dias: -12 cm/s
Left CCA prox sys: -111 cm/s
Left ICA dist dias: -13 cm/s
Left ICA prox dias: 25 cm/s
RCCAPSYS: 49 cm/s
RIGHT CCA MID DIAS: -8 cm/s
RIGHT ECA DIAS: -5 cm/s
Right CCA prox dias: 8 cm/s
Right cca dist sys: -65 cm/s

## 2018-02-19 ENCOUNTER — Emergency Department (HOSPITAL_COMMUNITY)
Admission: EM | Admit: 2018-02-19 | Discharge: 2018-02-19 | Disposition: A | Discharge status: Home / Self Care | Payer: Medicare Other | Attending: Emergency Medicine | Admitting: Emergency Medicine

## 2018-02-19 ENCOUNTER — Encounter (HOSPITAL_COMMUNITY): Payer: Self-pay | Admitting: Emergency Medicine

## 2018-02-19 DIAGNOSIS — M79604 Pain in right leg: Secondary | ICD-10-CM | POA: Diagnosis not present

## 2018-02-19 DIAGNOSIS — Z5321 Procedure and treatment not carried out due to patient leaving prior to being seen by health care provider: Secondary | ICD-10-CM | POA: Diagnosis not present

## 2018-02-19 NOTE — ED Triage Notes (Signed)
Pt c/o R leg pain extending from upper posterior thigh to lower leg. Pt denies injury Pt dx with gout in R great toe last week.

## 2018-02-19 NOTE — ED Notes (Signed)
Called patient for room, no answer 

## 2018-02-19 NOTE — ED Notes (Signed)
No answer

## 2018-04-08 DIAGNOSIS — H2513 Age-related nuclear cataract, bilateral: Secondary | ICD-10-CM

## 2018-04-08 HISTORY — DX: Age-related nuclear cataract, bilateral: H25.13

## 2018-11-21 ENCOUNTER — Encounter: Payer: Self-pay | Admitting: Cardiology

## 2018-11-21 ENCOUNTER — Ambulatory Visit: Payer: Medicare Other | Admitting: Podiatry

## 2018-11-21 ENCOUNTER — Encounter: Payer: Self-pay | Admitting: Podiatry

## 2018-11-21 ENCOUNTER — Ambulatory Visit (INDEPENDENT_AMBULATORY_CARE_PROVIDER_SITE_OTHER): Payer: Medicare Other | Admitting: Cardiology

## 2018-11-21 VITALS — BP 181/97 | HR 91

## 2018-11-21 VITALS — BP 162/70 | Ht 67.0 in | Wt 113.0 lb

## 2018-11-21 DIAGNOSIS — E782 Mixed hyperlipidemia: Secondary | ICD-10-CM

## 2018-11-21 DIAGNOSIS — I25119 Atherosclerotic heart disease of native coronary artery with unspecified angina pectoris: Secondary | ICD-10-CM | POA: Diagnosis not present

## 2018-11-21 DIAGNOSIS — I119 Hypertensive heart disease without heart failure: Secondary | ICD-10-CM | POA: Diagnosis not present

## 2018-11-21 DIAGNOSIS — L600 Ingrowing nail: Secondary | ICD-10-CM | POA: Diagnosis not present

## 2018-11-21 DIAGNOSIS — R0989 Other specified symptoms and signs involving the circulatory and respiratory systems: Secondary | ICD-10-CM

## 2018-11-21 HISTORY — DX: Other specified symptoms and signs involving the circulatory and respiratory systems: R09.89

## 2018-11-21 MED ORDER — CANDESARTAN CILEXETIL 16 MG PO TABS: 32.0000 mg | ORAL_TABLET | Freq: Every day | ORAL | 1 refills | Status: DC

## 2018-11-21 NOTE — Progress Notes (Signed)
Cardiology Office Note:    Date:  11/21/2018   ID:  Kristine Valdez, DOB 1926/08/27, MRN [id number]  PCP:  Kaleen Mask, MD  Cardiologist:  Garwin Brothers, MD   Referring MD: Kaleen Mask, *    ASSESSMENT:    1. Coronary artery disease with angina pectoris, unspecified vessel or lesion type, unspecified whether native or transplanted heart (HCC)   2. Hypertensive heart disease without CHF   3. Bilateral carotid bruits   4. Mixed hyperlipidemia    PLAN:    In order of problems listed above:  1. Secondary prevention stressed with the patient.  Importance of compliance with diet and medication stressed and she vocalized understanding.  Her blood pressure is elevated she tells me that it remains well at home have doubled her candesartan to 32 mg daily.  She will keep a track of her blood pressures and get it back to me in the next few days. 2. She has a significant bilateral carotid bruit right greater than left and she will have carotid Dopplers. 3. She will have blood work including fasting lipids in the next few days. 4. Echocardiogram will be done to assess murmur heard on auscultation. 5. Patient will be seen in follow-up appointment in 6 months or earlier if the patient has any concerns    Medication Adjustments/Labs and Tests Ordered: Current medicines are reviewed at length with the patient today.  Concerns regarding medicines are outlined above.  Orders Placed This Encounter  Procedures  . Basic metabolic panel  . TSH  . Hepatic function panel  . Lipid panel  . EKG 12-Lead  . ECHOCARDIOGRAM COMPLETE   Meds ordered this encounter  Medications  . candesartan (ATACAND) 16 MG tablet    Sig: Take 2 tablets (32 mg total) by mouth daily.    Dispense:  180 tablet    Refill:  1     History of Present Illness:    Kristine Valdez is a 85 y.o. female who is being seen today for the evaluation of coronary artery disease and to get established at the  request of Kaleen Mask, *.  She is a patient of Dr. Donnie Aho.  She has known coronary artery disease.  She denies any problems at this time and takes care of activities of daily living.  No chest pain orthopnea or PND.  She appears younger than stated age.  Her daughter accompanies her for this visit.  At the time of my evaluation, the patient is alert awake oriented and in no distress.  Past Medical History:  Diagnosis Date  . CHF (congestive heart failure) (HCC)   . Chronic systolic congestive heart failure, NYHA class 2 (HCC)   . Coronary artery disease   . Hyperlipidemia   . Hypertension   . Hypertensive heart disease without CHF   . Old anterior myocardial infarction     Past Surgical History:  Procedure Laterality Date  . ABDOMINAL HYSTERECTOMY    . LEFT HEART CATHETERIZATION WITH CORONARY ANGIOGRAM N/A 12/02/2011   Procedure: LEFT HEART CATHETERIZATION WITH CORONARY ANGIOGRAM;  Surgeon: Othella Boyer, MD;  Location: [facility] CATH LAB;  Service: Cardiovascular;  Laterality: N/A;    Current Medications: Current Meds  Medication Sig  . acetaminophen (TYLENOL) 325 MG tablet Take 650 mg by mouth every 6 (six) hours as needed for mild pain.  . candesartan (ATACAND) 16 MG tablet Take 2 tablets (32 mg total) by mouth daily.  . clopidogrel (PLAVIX) 75 MG  tablet Take 75 mg by mouth daily.  . furosemide (LASIX) 40 MG tablet Take 40 mg by mouth daily.  . metoprolol (LOPRESSOR) 100 MG tablet Take 0.5 tablets (50 mg total) by mouth 2 (two) times daily. (Patient taking differently: Take 50 mg by mouth daily. )  . [DISCONTINUED] candesartan (ATACAND) 16 MG tablet Take 16 mg by mouth daily.     Allergies:   Cortisone; Penicillins; Statins; Ace inhibitors; Codeine; and Hydrocodone-acetaminophen   Social History   Socioeconomic History  . Marital status: Widowed    Spouse name: Not on file  . Number of children: Not on file  . Years of education: Not on file  . Highest education level:  Not on file  Occupational History  . Not on file  Social Needs  . Financial resource strain: Not on file  . Food insecurity:    Worry: Not on file    Inability: Not on file  . Transportation needs:    Medical: Not on file    Non-medical: Not on file  Tobacco Use  . Smoking status: Never Smoker  . Smokeless tobacco: Never Used  Substance and Sexual Activity  . Alcohol use: No  . Drug use: No  . Sexual activity: Never  Lifestyle  . Physical activity:    Days per week: Not on file    Minutes per session: Not on file  . Stress: Not on file  Relationships  . Social connections:    Talks on phone: Not on file    Gets together: Not on file    Attends religious service: Not on file    Active member of club or organization: Not on file    Attends meetings of clubs or organizations: Not on file    Relationship status: Not on file  Other Topics Concern  . Not on file  Social History Narrative   Widow.  Lives alone     Family History: The patient's family history includes Breast cancer in her sister; Cancer in her brother; Coronary artery disease in her brother; Heart attack in her father; Heart failure in her mother.  ROS:   Please see the history of present illness.    All other systems reviewed and are negative.  EKGs/Labs/Other Studies Reviewed:    The following studies were reviewed today: I discussed my findings with the patient at length.  EKG reveals sinus rhythm and nonspecific ST-T changes.   Recent Labs: No results found for requested labs within last 8760 hours.  Recent Lipid Panel    Component Value Date/Time   CHOL 287 (H) 12/02/2011 0940   TRIG 68 12/02/2011 0940   HDL 109 12/02/2011 0940   CHOLHDL 2.6 12/02/2011 0940   VLDL 14 12/02/2011 0940   LDLCALC 164 (H) 12/02/2011 0940    Physical Exam:    VS:  BP (!) 162/70 (BP Location: Right Arm, Patient Position: Sitting, Cuff Size: Normal)   Ht 5\' 7"  (1.702 m)   Wt 113 lb (51.3 kg)   SpO2 98%   BMI  17.70 kg/m     Wt Readings from Last 3 Encounters:  11/21/18 113 lb (51.3 kg)  02/19/18 116 lb (52.6 kg)  04/16/17 123 lb (55.8 kg)     GEN: Patient is in no acute distress HEENT: Normal NECK: No JVD; bilateral carotid bruits right greater than left LYMPHATICS: No lymphadenopathy CARDIAC: S1 S2 regular, 2/6 systolic murmur at the apex. RESPIRATORY:  Clear to auscultation without rales, wheezing or rhonchi  ABDOMEN:  Soft, non-tender, non-distended MUSCULOSKELETAL:  No edema; No deformity  SKIN: Warm and dry NEUROLOGIC:  Alert and oriented x 3 PSYCHIATRIC:  Normal affect    Signed, Garwin Brothers, MD  11/21/2018 12:04 PM    [facility]

## 2018-11-21 NOTE — Patient Instructions (Addendum)
Medication Instructions:  Your physician has recommended you make the following change in your medication:   Increase: Candesartan to 32 mg daily   If you need a refill on your cardiac medications before your next appointment, please call your pharmacy.   Lab work: Your physician recommends that you return for lab work today: Bmp, tsh, lft, and lipids.  If you have labs (blood work) drawn today and your tests are completely normal, you will receive your results only by: Marland Kitchen MyChart Message (if you have MyChart) OR . A paper copy in the mail If you have any lab test that is abnormal or we need to change your treatment, we will call you to review the results.  Testing/Procedures: Your physician has requested that you have an echocardiogram. Echocardiography is a painless test that uses sound waves to create images of your heart. It provides your doctor with information about the size and shape of your heart and how well your heart's chambers and valves are working. This procedure takes approximately one hour. There are no restrictions for this procedure.  Your physician has requested that you have a carotid duplex. This test is an ultrasound of the carotid arteries in your neck. It looks at blood flow through these arteries that supply the brain with blood. Allow one hour for this exam. There are no restrictions or special instructions.      Follow-Up: At [facility], you and your health needs are our priority.  As part of our continuing mission to provide you with exceptional heart care, we have created designated Provider Care Teams.  These Care Teams include your primary Cardiologist (physician) and Advanced Practice Providers (APPs -  Physician Assistants and Nurse Practitioners) who all work together to provide you with the care you need, when you need it. You will need a follow up appointment in 6 months.  Please call our office 2 months in advance to schedule this appointment.  You may  see No primary care provider on file. or another member of our [facility] in [place]: Gypsy Balsam, MD . Norman Herrlich, MD  Any Other Special Instructions Will Be Listed Below (If Applicable).   Echocardiogram An echocardiogram is a procedure that uses painless sound waves (ultrasound) to produce an image of the heart. Images from an echocardiogram can provide important information about:  Signs of coronary artery disease (CAD).  Aneurysm detection. An aneurysm is a weak or damaged part of an artery wall that bulges out from the normal force of blood pumping through the body.  Heart size and shape. Changes in the size or shape of the heart can be associated with certain conditions, including heart failure, aneurysm, and CAD.  Heart muscle function.  Heart valve function.  Signs of a past heart attack.  Fluid buildup around the heart.  Thickening of the heart muscle.  A tumor or infectious growth around the heart valves. Tell a health care provider about:  Any allergies you have.  All medicines you are taking, including vitamins, herbs, eye drops, creams, and over-the-counter medicines.  Any blood disorders you have.  Any surgeries you have had.  Any medical conditions you have.  Whether you are pregnant or may be pregnant. What are the risks? Generally, this is a safe procedure. However, problems may occur, including:  Allergic reaction to dye (contrast) that may be used during the procedure. What happens before the procedure? No specific preparation is needed. You may eat and drink normally. What  happens during the procedure?   An IV tube may be inserted into one of your veins.  You may receive contrast through this tube. A contrast is an injection that improves the quality of the pictures from your heart.  A gel will be applied to your chest.  A wand-like tool (transducer) will be moved over your chest. The gel will help to transmit the  sound waves from the transducer.  The sound waves will harmlessly bounce off of your heart to allow the heart images to be captured in real-time motion. The images will be recorded on a computer. The procedure may vary among health care providers and hospitals. What happens after the procedure?  You may return to your normal, everyday life, including diet, activities, and medicines, unless your health care provider tells you not to do that. Summary  An echocardiogram is a procedure that uses painless sound waves (ultrasound) to produce an image of the heart.  Images from an echocardiogram can provide important information about the size and shape of your heart, heart muscle function, heart valve function, and fluid buildup around your heart.  You do not need to do anything to prepare before this procedure. You may eat and drink normally.  After the echocardiogram is completed, you may return to your normal, everyday life, unless your health care provider tells you not to do that. This information is not intended to replace advice given to you by your health care provider. Make sure you discuss any questions you have with your health care provider. Document Released: 10/14/2000 Document Revised: 11/19/2016 Document Reviewed: 11/19/2016 Elsevier Interactive Patient Education  2019 [employer].

## 2018-11-21 NOTE — Progress Notes (Signed)
Subjective:   Patient ID: Kristine Valdez, female   DOB: 85 y.o.   MRN: [id number]   HPI Patient presents with painful ingrown toenail deformity left second toe and states it is hard for her to wear shoe gear and she is tried to trim and soak it without relief of symptoms and presents with caregiver.  Patient does not smoke likes to be active   Review of Systems  All other systems reviewed and are negative.       Objective:  Physical Exam Vitals signs and nursing note reviewed.  Constitutional:      Appearance: She is well-developed.  Pulmonary:     Effort: Pulmonary effort is normal.  Musculoskeletal: Normal range of motion.  Skin:    General: Skin is warm.  Neurological:     Mental Status: She is alert.     Neurovascular status found to be intact muscle strength was adequate range of motion within normal limits with incurvated lateral border left second toe that is painful when pressed and make shoe gear difficult.  Patient was noted to have good digital perfusion is well oriented x3 and toes were warm    Assessment:  Ingrown toenail deformity second digit left that is painful when pressed lateral border with no redness or drainage noted     Plan:  H&P condition reviewed and different options discussed.  Patient wants to get this fixed and I explained procedure to patient and caregiver and after review they signed consent form understanding risk.  Today I infiltrated the left second toe 60 mg like Marcaine mixture removed the lateral border second nail with sterile instrumentation exposed matrix and applied phenol 3 applications 30 seconds followed by alcohol lavage and sterile dressing.  Gave instructions on soaks and reappoint and leave dressing on 24 hours but take it off earlier if throbbing should occur.  Encouraged to call with questions

## 2018-11-21 NOTE — Patient Instructions (Signed)

## 2018-12-04 ENCOUNTER — Ambulatory Visit (HOSPITAL_BASED_OUTPATIENT_CLINIC_OR_DEPARTMENT_OTHER)
Admission: RE | Admit: 2018-12-04 | Discharge: 2018-12-04 | Disposition: A | Discharge status: Home / Self Care | Payer: Medicare Other | Source: Ambulatory Visit | Attending: Cardiology | Admitting: Cardiology

## 2018-12-04 DIAGNOSIS — I25119 Atherosclerotic heart disease of native coronary artery with unspecified angina pectoris: Secondary | ICD-10-CM

## 2018-12-04 DIAGNOSIS — I119 Hypertensive heart disease without heart failure: Secondary | ICD-10-CM | POA: Diagnosis present

## 2018-12-04 NOTE — Progress Notes (Signed)
  Echocardiogram 2D Echocardiogram has been performed.  Maripaz Mullan T Nethra Mehlberg 12/04/2018, 11:53 AM

## 2018-12-04 NOTE — Progress Notes (Signed)
Carotid Duplex Ultrasound Doppler Bilaterally   Right Carotid:Velocities in the right ICA are consistent with a 1-39% stenosis. Non-hemodynamically significant plaque noted in the CCA. The ECA appears <50% stenosed.    Left Carotid: Velocities in the left ICA are consistent with a 40-59% stenosis. Non-hemodynamically significant plaque noted in the CCA. The ECA appears >50% stenosed.    Vertebrals: Bilateral vertebral arteries demonstrate antegrade flow.  Subclavians:Normal flow hemodynamics were seen in bilateral subclavian arteries.   12/04/18 Sinda Du RDCS, RVT

## 2018-12-05 LAB — BASIC METABOLIC PANEL
BUN/Creatinine Ratio: 18 (ref 12–28)
BUN: 21 mg/dL (ref 10–36)
CALCIUM: 10.2 mg/dL (ref 8.7–10.3)
CHLORIDE: 100 mmol/L (ref 96–106)
CO2: 23 mmol/L (ref 20–29)
Creatinine, Ser: 1.2 mg/dL — ABNORMAL HIGH (ref 0.57–1.00)
GFR calc Af Amer: 46 mL/min/{1.73_m2} — ABNORMAL LOW (ref >59)
GFR, EST NON AFRICAN AMERICAN: 40 mL/min/{1.73_m2} — AB (ref >59)
Glucose: 104 mg/dL — ABNORMAL HIGH (ref 65–99)
POTASSIUM: 4.5 mmol/L (ref 3.5–5.2)
SODIUM: 138 mmol/L (ref 134–144)

## 2018-12-05 LAB — LIPID PANEL
CHOLESTEROL TOTAL: 236 mg/dL — AB (ref 100–199)
Chol/HDL Ratio: 2.7 ratio (ref 0.0–4.4)
HDL: 86 mg/dL (ref >39)
LDL Calculated: 132 mg/dL — ABNORMAL HIGH (ref 0–99)
TRIGLYCERIDES: 91 mg/dL (ref 0–149)
VLDL CHOLESTEROL CAL: 18 mg/dL (ref 5–40)

## 2018-12-05 LAB — HEPATIC FUNCTION PANEL
ALT: 7 IU/L (ref 0–32)
AST: 14 IU/L (ref 0–40)
Albumin: 3.8 g/dL (ref 3.5–4.6)
Alkaline Phosphatase: 80 IU/L (ref 39–117)
Bilirubin Total: 0.5 mg/dL (ref 0.0–1.2)
Bilirubin, Direct: 0.14 mg/dL (ref 0.00–0.40)
Total Protein: 7.1 g/dL (ref 6.0–8.5)

## 2018-12-05 LAB — TSH: TSH: 1.48 u[IU]/mL (ref 0.450–4.500)

## 2018-12-06 ENCOUNTER — Telehealth: Payer: Self-pay

## 2018-12-06 ENCOUNTER — Encounter: Payer: Self-pay | Admitting: *Deleted

## 2018-12-06 NOTE — Telephone Encounter (Signed)
Patient called and notified of test results. 

## 2018-12-06 NOTE — Telephone Encounter (Signed)
-----   Message from Garwin Brothers, MD sent at 12/06/2018 11:54 AM EST ----- The results of the study is unremarkable. Please inform patient. I will discuss in detail at next appointment. Cc  primary care/referring physician Garwin Brothers, MD 12/06/2018 11:54 AM

## 2018-12-10 ENCOUNTER — Encounter: Payer: Self-pay | Admitting: Podiatry

## 2018-12-10 ENCOUNTER — Ambulatory Visit: Payer: Medicare Other | Admitting: Podiatry

## 2018-12-10 DIAGNOSIS — L03032 Cellulitis of left toe: Secondary | ICD-10-CM

## 2018-12-13 ENCOUNTER — Other Ambulatory Visit: Payer: Self-pay | Admitting: Cardiology

## 2018-12-13 ENCOUNTER — Ambulatory Visit: Payer: Medicare Other | Admitting: Podiatry

## 2018-12-13 MED ORDER — CLOPIDOGREL BISULFATE 75 MG PO TABS: 75.0000 mg | ORAL_TABLET | Freq: Every day | ORAL | 4 refills | Status: DC

## 2018-12-13 NOTE — Telephone Encounter (Signed)
° °  1. Which medications need to be refilled? (please list name of each medication and dose if known) Clopidogrel 75mg  tablets once daily  2. Which pharmacy/location (including street and city if local pharmacy) is medication to be sent to? [facility]  3. Do they need a 30 day or 90 day supply? 90

## 2018-12-16 NOTE — Progress Notes (Signed)
Subjective:   Patient ID: Kristine Valdez, female   DOB: 85 y.o.   MRN: [id number]   HPI Patient presents with caregiver concerned about discoloration of the left second toe after nail removal along with some blistering which seemed to resolve but still concerned   ROS      Objective:  Physical Exam  Neurovascular status intact with patient's left hallux showing good digital perfusion and the nailbed itself crusted over with slight blistering proximal to this that is localized with no proximal edema erythema or drainage noted     Assessment:  Probability that healing is occurring well from having previous nail removal with low-grade irritation of tissue with possibility for low-grade paronychia infection     Plan:  H&P education to her and caregiver rendered and we will continue with soaks and bandaging and utilize Silvadene.  I then discussed that if any further redness or active drainage or any other pathology were to occur to reappoint immediately but this should heal uneventfully in the next several weeks

## 2019-02-11 ENCOUNTER — Telehealth: Payer: Self-pay | Admitting: Cardiology

## 2019-02-11 MED ORDER — CLOPIDOGREL BISULFATE 75 MG PO TABS: 75.0000 mg | ORAL_TABLET | Freq: Every day | ORAL | 1 refills | Status: DC

## 2019-02-11 NOTE — Telephone Encounter (Signed)
Plavix refill sent to [facility] per patient preference

## 2019-02-11 NOTE — Telephone Encounter (Signed)
°*  STAT* If patient is at the pharmacy, call can be transferred to refill team.   1. Which medications need to be refilled? (please list name of each medication and dose if known) Clopidogrel Bisulfate 75mg  tablet once daily  2. Which pharmacy/location (including street and city if local pharmacy) is medication to be sent to?[facility]  3. Do they need a 30 day or 90 day supply? 90

## 2019-06-13 ENCOUNTER — Other Ambulatory Visit: Payer: Self-pay | Admitting: Cardiology

## 2019-06-13 MED ORDER — FUROSEMIDE 40 MG PO TABS: 40.0000 mg | ORAL_TABLET | Freq: Every day | ORAL | 0 refills | Status: DC

## 2019-06-13 NOTE — Telephone Encounter (Signed)
Lasix refill sent to [place]

## 2019-06-13 NOTE — Telephone Encounter (Signed)
°*  STAT* If patient is at the pharmacy, call can be transferred to refill team.   1. Which medications need to be refilled? (please list name of each medication and dose if known) Furosemide 40mg  tablet  2. Which pharmacy/location (including street and city if local pharmacy) is medication to be sent to? [facility]  3. Do they need a 30 day or 90 day supply? [place]

## 2019-06-14 ENCOUNTER — Other Ambulatory Visit: Payer: Self-pay | Admitting: Cardiology

## 2019-06-14 MED ORDER — FUROSEMIDE 40 MG PO TABS: 40.0000 mg | ORAL_TABLET | Freq: Every day | ORAL | 1 refills | Status: DC

## 2019-06-14 NOTE — Telephone Encounter (Signed)
Refill sent.

## 2019-06-14 NOTE — Telephone Encounter (Signed)
°*  STAT* If patient is at the pharmacy, call can be transferred to refill team.   1. Which medications need to be refilled? (please list name of each medication and dose if known) furosemide 40mg  tablet  2. Which pharmacy/location (including street and city if local pharmacy) is medication to be sent to?[facility]  3. Do they need a 30 day or 90 day supply? [place]

## 2019-07-22 ENCOUNTER — Encounter: Payer: Self-pay | Admitting: Cardiology

## 2019-07-22 ENCOUNTER — Ambulatory Visit (INDEPENDENT_AMBULATORY_CARE_PROVIDER_SITE_OTHER): Payer: Medicare Other | Admitting: Cardiology

## 2019-07-22 ENCOUNTER — Other Ambulatory Visit: Payer: Self-pay

## 2019-07-22 VITALS — BP 150/70 | HR 92 | Temp 97.3°F | Ht 67.0 in | Wt 111.0 lb

## 2019-07-22 DIAGNOSIS — E782 Mixed hyperlipidemia: Secondary | ICD-10-CM | POA: Diagnosis not present

## 2019-07-22 DIAGNOSIS — I2510 Atherosclerotic heart disease of native coronary artery without angina pectoris: Secondary | ICD-10-CM | POA: Diagnosis not present

## 2019-07-22 NOTE — Progress Notes (Signed)
Cardiology Office Note:    Date:  07/22/2019   ID:  Kristine Valdez, DOB [birth year], MRN [id number]  PCP:  Kaleen MaskElkins, Wilson Oliver, MD  Cardiologist:  Garwin Brothersajan R Gage Weant, MD   Referring MD: Kaleen MaskElkins, Wilson Oliver, *    ASSESSMENT:    1. Coronary artery disease involving native coronary artery of native heart without angina pectoris   2. Mixed hyperlipidemia    PLAN:    In order of problems listed above:  1. Coronary artery disease: Secondary prevention stressed with the patient.  Importance of compliance with diet and medication stressed and she vocalized understanding. 2. Essential hypertension: Her blood pressure stable.  She has an element of whitecoat hypertension.  The last time we increase some of her medication she could not tolerate it. 3. Mixed dyslipidemia: She does not want to try any lipid-lowering medications.  Her LDL is elevated and she mentions to me that she is willing to take the risk based on her age respect her wishes. 4. Patient will be seen in follow-up appointment in 6 months or earlier if the patient has any concerns    Medication Adjustments/Labs and Tests Ordered: Current medicines are reviewed at length with the patient today.  Concerns regarding medicines are outlined above.  No orders of the defined types were placed in this encounter.  No orders of the defined types were placed in this encounter.    Chief Complaint  Patient presents with  . Follow-up     History of Present Illness:    Kristine Valdez is a 85 y.o. female.  Patient has known coronary artery disease and essential hypertension.  She denies any problems at this time doing takes care of activities of daily living.  No chest pain orthopnea or PND.  She is in amazing good health for her age and is very sharp mentally also.  At the time of my evaluation, the patient is alert awake oriented and in no distress.  Past Medical History:  Diagnosis Date  . CHF (congestive heart failure) (HCC)    . Chronic systolic congestive heart failure, NYHA class 2 (HCC)   . Coronary artery disease   . Hyperlipidemia   . Hypertension   . Hypertensive heart disease without CHF   . Old anterior myocardial infarction     Past Surgical History:  Procedure Laterality Date  . ABDOMINAL HYSTERECTOMY    . LEFT HEART CATHETERIZATION WITH CORONARY ANGIOGRAM N/A 12/02/2011   Procedure: LEFT HEART CATHETERIZATION WITH CORONARY ANGIOGRAM;  Surgeon: Othella BoyerWilliam S Tilley, MD;  [facility];  Service: Cardiovascular;  Laterality: N/A;    Current Medications: Current Meds  Medication Sig  . acetaminophen (TYLENOL) 325 MG tablet Take 650 mg by mouth every 6 (six) hours as needed for mild pain.  . candesartan (ATACAND) 16 MG tablet Take 2 tablets (32 mg total) by mouth daily. (Patient taking differently: Take 16 mg by mouth daily. )  . clopidogrel (PLAVIX) 75 MG tablet Take 1 tablet (75 mg total) by mouth daily.  . furosemide (LASIX) 40 MG tablet Take 1 tablet (40 mg total) by mouth daily.  . metoprolol (LOPRESSOR) 100 MG tablet Take 0.5 tablets (50 mg total) by mouth 2 (two) times daily. (Patient taking differently: Take 100 mg by mouth daily. )  . nitroGLYCERIN (NITROSTAT) 0.4 MG SL tablet Place 1 tablet (0.4 mg total) under the tongue every 5 (five) minutes x 3 doses as needed for chest pain.  Marland Kitchen. VITAMIN D PO Take by  mouth.     Allergies:   Cortisone, Penicillins, Statins, Ace inhibitors, Codeine, and Hydrocodone-acetaminophen   Social History   Socioeconomic History  . Marital status: Widowed    Spouse name: Not on file  . Number of children: Not on file  . Years of education: Not on file  . Highest education level: Not on file  Occupational History  . Not on file  Social Needs  . Financial resource strain: Not on file  . Food insecurity    Worry: Not on file    Inability: Not on file  . Transportation needs    Medical: Not on file    Non-medical: Not on file  Tobacco Use  . Smoking  status: Never Smoker  . Smokeless tobacco: Never Used  Substance and Sexual Activity  . Alcohol use: No  . Drug use: No  . Sexual activity: Never  Lifestyle  . Physical activity    Days per week: Not on file    Minutes per session: Not on file  . Stress: Not on file  Relationships  . Social Musician on phone: Not on file    Gets together: Not on file    Attends religious service: Not on file    Active member of club or organization: Not on file    Attends meetings of clubs or organizations: Not on file    Relationship status: Not on file  Other Topics Concern  . Not on file  Social History Narrative   Widow.  Lives alone     Family History: The patient's family history includes Breast cancer in her sister; Cancer in her brother; Coronary artery disease in her brother; Heart attack in her father; Heart failure in her mother.  ROS:   Please see the history of present illness.    All other systems reviewed and are negative.  EKGs/Labs/Other Studies Reviewed:    The following studies were reviewed today: IMPRESSIONS    1. The left ventricle has normal systolic function of 60-65%. The cavity size is normal. There is no increased left ventricular wall thickness. Echo evidence of impaired diastolic relaxation.  2. Moderately dilated left atrial size.  3. The mitral valve is normal in structure.  4. The aortic valve is normal in structure and function.  5. Moderate aortic regurgitation.    Recent Labs: 12/04/2018: ALT 7; BUN 21; Creatinine, Ser 1.20; Potassium 4.5; Sodium 138; TSH 1.480  Recent Lipid Panel    Component Value Date/Time   CHOL 236 (H) 12/04/2018 0952   TRIG 91 12/04/2018 0952   HDL 86 12/04/2018 0952   CHOLHDL 2.7 12/04/2018 0952   CHOLHDL 2.6 12/02/2011 0940   VLDL 14 12/02/2011 0940   LDLCALC 132 (H) 12/04/2018 0952    Physical Exam:    VS:  BP (!) 150/70 (BP Location: Left Arm, Patient Position: Standing, Cuff Size: Normal)   Pulse  92   Temp (!) 97.3 F (36.3 C)   Ht 5\' 7"  (1.702 m)   Wt 111 lb (50.3 kg)   SpO2 95%   BMI 17.39 kg/m     Wt Readings from Last 3 Encounters:  07/22/19 111 lb (50.3 kg)  11/21/18 113 lb (51.3 kg)  02/19/18 116 lb (52.6 kg)     GEN: Patient is in no acute distress HEENT: Normal NECK: No JVD; No carotid bruits LYMPHATICS: No lymphadenopathy CARDIAC: Hear sounds regular, 2/6 systolic murmur at the apex. RESPIRATORY:  Clear to auscultation without rales, wheezing  or rhonchi  ABDOMEN: Soft, non-tender, non-distended MUSCULOSKELETAL:  No edema; No deformity  SKIN: Warm and dry NEUROLOGIC:  Alert and oriented x 3 PSYCHIATRIC:  Normal affect   Signed, Jenean Lindau, MD  07/22/2019 3:47 PM    [facility]

## 2019-07-22 NOTE — Patient Instructions (Signed)

## 2019-08-27 ENCOUNTER — Other Ambulatory Visit: Payer: Self-pay | Admitting: Cardiology

## 2019-08-27 NOTE — Telephone Encounter (Signed)
Rx refill sent to pharmacy. 

## 2020-02-11 ENCOUNTER — Other Ambulatory Visit: Payer: Self-pay | Admitting: Cardiology

## 2020-02-26 ENCOUNTER — Other Ambulatory Visit: Payer: Self-pay

## 2020-02-26 ENCOUNTER — Telehealth: Payer: Self-pay | Admitting: Cardiology

## 2020-02-26 MED ORDER — CANDESARTAN CILEXETIL 16 MG PO TABS: 32.0000 mg | ORAL_TABLET | Freq: Every day | ORAL | 0 refills | Status: DC

## 2020-02-26 NOTE — Telephone Encounter (Signed)
° °  Pt c/o medication issue:  1. Name of Medication: candesartan (ATACAND) 16 MG tablet  2. How are you currently taking this medication (dosage and times per day)? TAKE 2 TABLETS BY MOUTH DAILY  3. Are you having a reaction (difficulty breathing--STAT)?   4. What is your medication issue? Pt said her pcp changed dosage of her Candesartan from 2 tablets a day to 1 tablets a day because its making her eyes blurry. She needs prescription sent with new dosage instruction to be sent to [facility] [place] - [id number] Pt said she needs it as soon as possible since she only have 2 tablets left  Also, pt would like to know if its time for her to see Dr, Henrietta Hoover for f/u

## 2020-02-26 NOTE — Telephone Encounter (Signed)
Refill sent to preferred drug store. Called pt and scheduled an appointment fo r5/6/21.

## 2020-03-05 ENCOUNTER — Other Ambulatory Visit: Payer: Self-pay

## 2020-03-05 ENCOUNTER — Encounter: Payer: Self-pay | Admitting: Cardiology

## 2020-03-05 ENCOUNTER — Ambulatory Visit: Payer: Medicare Other | Admitting: Cardiology

## 2020-03-05 VITALS — BP 184/98 | HR 84 | Ht 67.0 in | Wt 114.0 lb

## 2020-03-05 DIAGNOSIS — E782 Mixed hyperlipidemia: Secondary | ICD-10-CM | POA: Diagnosis not present

## 2020-03-05 DIAGNOSIS — I10 Essential (primary) hypertension: Secondary | ICD-10-CM

## 2020-03-05 DIAGNOSIS — I2510 Atherosclerotic heart disease of native coronary artery without angina pectoris: Secondary | ICD-10-CM

## 2020-03-05 HISTORY — DX: Essential (primary) hypertension: I10

## 2020-03-05 LAB — BASIC METABOLIC PANEL
BUN/Creatinine Ratio: 15 (ref 12–28)
BUN: 18 mg/dL (ref 10–36)
CO2: 25 mmol/L (ref 20–29)
Calcium: 10.4 mg/dL — ABNORMAL HIGH (ref 8.7–10.3)
Chloride: 99 mmol/L (ref 96–106)
Creatinine, Ser: 1.18 mg/dL — ABNORMAL HIGH (ref 0.57–1.00)
GFR calc Af Amer: 47 mL/min/{1.73_m2} — ABNORMAL LOW (ref >59)
GFR calc non Af Amer: 40 mL/min/{1.73_m2} — ABNORMAL LOW (ref >59)
Glucose: 82 mg/dL (ref 65–99)
Potassium: 4.5 mmol/L (ref 3.5–5.2)
Sodium: 137 mmol/L (ref 134–144)

## 2020-03-05 LAB — TSH: TSH: 1.53 u[IU]/mL (ref 0.450–4.500)

## 2020-03-05 NOTE — Progress Notes (Signed)
Cardiology Office Note:    Date:  03/05/2020   ID:  Kristine Valdez, DOB Jun 29, 1926, MRN [id number]  PCP:  Kaleen Mask, MD  Cardiologist:  Garwin Brothers, MD   Referring MD: Kaleen Mask, *    ASSESSMENT:    1. Coronary artery disease involving native coronary artery of native heart without angina pectoris   2. Mixed hyperlipidemia   3. Essential hypertension    PLAN:    In order of problems listed above:  1. Coronary artery disease: Secondary prevention stressed with the patient.  Importance of compliance with diet medication stressed and she vocalized understanding. 2. Essential hypertension: I think part of this is her pain from sciatica.  I told her that she could get in touch with her primary care physician and probably use medication such as Tylenol for her pain hopefully this will help her blood pressure.  I will leave this issue to the expertise of her primary care physician.  Patient was advised to keep a track of her blood pressures twice a day and send the readings to me in the mail for review and intervention.  Daughter vocalized understanding and is agreeable. 3. Mixed dyslipidemia: Lipids reviewed.  She does not want any lipid-lowering therapy and I respect her wishes. 4. Patient will be seen in follow-up appointment in 6 months or earlier if the patient has any concerns    Medication Adjustments/Labs and Tests Ordered: Current medicines are reviewed at length with the patient today.  Concerns regarding medicines are outlined above.  Orders Placed This Encounter  Procedures  . Basic metabolic panel  . TSH  . EKG 12-Lead   No orders of the defined types were placed in this encounter.    Chief Complaint  Patient presents with  . Follow-up    6 Months     History of Present Illness:    Kristine Valdez is a 86 y.o. female.  Patient has past medical history of coronary artery disease essential hypertension and dyslipidemia.  She denies any  problems at this time and takes care of activities of daily living.  She has issues with sciatica.  She tells me that she uses Naprosyn and this elevated her blood pressure.  Subsequently she is stopped using it.  She has significant pain issues because of sciatica and feels that her blood pressure issue might be because of the pain.  Her daughter accompanies her for this visit.  At the time of my evaluation, the patient is alert awake oriented and in no distress.  She denies any chest pain orthopnea or PND.  Past Medical History:  Diagnosis Date  . CHF (congestive heart failure) (HCC)   . Chronic systolic congestive heart failure, NYHA class 2 (HCC)   . Coronary artery disease   . Hyperlipidemia   . Hypertension   . Hypertensive heart disease without CHF   . Old anterior myocardial infarction     Past Surgical History:  Procedure Laterality Date  . ABDOMINAL HYSTERECTOMY    . LEFT HEART CATHETERIZATION WITH CORONARY ANGIOGRAM N/A 12/02/2011   Procedure: LEFT HEART CATHETERIZATION WITH CORONARY ANGIOGRAM;  Surgeon: Othella Boyer, MD;  Location: [facility] CATH LAB;  Service: Cardiovascular;  Laterality: N/A;    Current Medications: Current Meds  Medication Sig  . acetaminophen (TYLENOL) 325 MG tablet Take 650 mg by mouth every 6 (six) hours as needed for mild pain.  . candesartan (ATACAND) 16 MG tablet Take 2 tablets (32 mg total) by  mouth daily. (Patient taking differently: Take 16 mg by mouth daily. )  . clopidogrel (PLAVIX) 75 MG tablet Take 1 tablet (75 mg total) by mouth daily.  . furosemide (LASIX) 40 MG tablet TAKE 1 TABLET BY MOUTH DAILY  . metoprolol (LOPRESSOR) 100 MG tablet Take 0.5 tablets (50 mg total) by mouth 2 (two) times daily. (Patient taking differently: Take 100 mg by mouth daily. )  . naproxen (NAPROSYN) 500 MG tablet Take 500 mg by mouth 2 (two) times daily.  . nitroGLYCERIN (NITROSTAT) 0.4 MG SL tablet Place 1 tablet (0.4 mg total) under the tongue every 5 (five) minutes  x 3 doses as needed for chest pain.  Marland Kitchen VITAMIN D PO Take by mouth.     Allergies:   Cortisone, Penicillins, Statins, Ace inhibitors, Codeine, and Hydrocodone-acetaminophen   Social History   Socioeconomic History  . Marital status: Widowed    Spouse name: Not on file  . Number of children: Not on file  . Years of education: Not on file  . Highest education level: Not on file  Occupational History  . Not on file  Tobacco Use  . Smoking status: Never Smoker  . Smokeless tobacco: Never Used  Substance and Sexual Activity  . Alcohol use: No  . Drug use: No  . Sexual activity: Never  Other Topics Concern  . Not on file  Social History Narrative   Widow.  Lives alone   Social Determinants of Health   Financial Resource Strain:   . Difficulty of Paying Living Expenses:   Food Insecurity:   . Worried About Programme researcher, broadcasting/film/video in the Last Year:   . Barista in the Last Year:   Transportation Needs:   . Freight forwarder (Medical):   Marland Kitchen Lack of Transportation (Non-Medical):   Physical Activity:   . Days of Exercise per Week:   . Minutes of Exercise per Session:   Stress:   . Feeling of Stress :   Social Connections:   . Frequency of Communication with Friends and Family:   . Frequency of Social Gatherings with Friends and Family:   . Attends Religious Services:   . Active Member of Clubs or Organizations:   . Attends Banker Meetings:   Marland Kitchen Marital Status:      Family History: The patient's family history includes Breast cancer in her sister; Cancer in her brother; Coronary artery disease in her brother; Heart attack in her father; Heart failure in her mother.  ROS:   Please see the history of present illness.    All other systems reviewed and are negative.  EKGs/Labs/Other Studies Reviewed:    The following studies were reviewed today: EKG reveals sinus rhythm and nonspecific ST-T changes   Recent Labs: No results found for requested labs  within last 8760 hours.  Recent Lipid Panel    Component Value Date/Time   CHOL 236 (H) 12/04/2018 0952   TRIG 91 12/04/2018 0952   HDL 86 12/04/2018 0952   CHOLHDL 2.7 12/04/2018 0952   CHOLHDL 2.6 12/02/2011 0940   VLDL 14 12/02/2011 0940   LDLCALC 132 (H) 12/04/2018 0952    Physical Exam:    VS:  BP (!) 184/98   Pulse 84   Ht 5\' 7"  (1.702 m)   Wt 114 lb (51.7 kg)   SpO2 99%   BMI 17.85 kg/m     Wt Readings from Last 3 Encounters:  03/05/20 114 lb (51.7 kg)  07/22/19  111 lb (50.3 kg)  11/21/18 113 lb (51.3 kg)     GEN: Patient is in no acute distress HEENT: Normal NECK: No JVD; No carotid bruits LYMPHATICS: No lymphadenopathy CARDIAC: Hear sounds regular, 2/6 systolic murmur at the apex. RESPIRATORY:  Clear to auscultation without rales, wheezing or rhonchi  ABDOMEN: Soft, non-tender, non-distended MUSCULOSKELETAL:  No edema; No deformity  SKIN: Warm and dry NEUROLOGIC:  Alert and oriented x 3 PSYCHIATRIC:  Normal affect   Signed, Jenean Lindau, MD  03/05/2020 11:05 AM    [place]

## 2020-03-05 NOTE — Patient Instructions (Signed)
Medication Instructions:  No medication changes. *If you need a refill on your cardiac medications before your next appointment, please call your pharmacy*   Lab Work: Your physician recommends that you have a BMET and TSH today in the office.  If you have labs (blood work) drawn today and your tests are completely normal, you will receive your results only by: . MyChart Message (if you have MyChart) OR . A paper copy in the mail If you have any lab test that is abnormal or we need to change your treatment, we will call you to review the results.   Testing/Procedures: None ordered   Follow-Up: At [facility], you and your health needs are our priority.  As part of our continuing mission to provide you with exceptional heart care, we have created designated Provider Care Teams.  These Care Teams include your primary Cardiologist (physician) and Advanced Practice Providers (APPs -  Physician Assistants and Nurse Practitioners) who all work together to provide you with the care you need, when you need it.  We recommend signing up for the patient portal called "MyChart".  Sign up information is provided on this After Visit Summary.  MyChart is used to connect with patients for Virtual Visits (Telemedicine).  Patients are able to view lab/test results, encounter notes, upcoming appointments, etc.  Non-urgent messages can be sent to your provider as well.   To learn more about what you can do with MyChart, go to [email or web address].    Your next appointment:   6 month(s)  The format for your next appointment:   In Person  Provider:   Rajan Revankar, MD   Other Instructions NA  

## 2020-08-31 DIAGNOSIS — I2510 Atherosclerotic heart disease of native coronary artery without angina pectoris: Secondary | ICD-10-CM | POA: Insufficient documentation

## 2020-08-31 DIAGNOSIS — I10 Essential (primary) hypertension: Secondary | ICD-10-CM | POA: Insufficient documentation

## 2020-09-01 ENCOUNTER — Ambulatory Visit: Payer: Medicare Other | Admitting: Cardiology

## 2020-09-01 ENCOUNTER — Other Ambulatory Visit: Payer: Self-pay

## 2020-09-01 ENCOUNTER — Encounter: Payer: Self-pay | Admitting: Cardiology

## 2020-09-01 VITALS — BP 178/81 | HR 79 | Ht 67.0 in | Wt 110.8 lb

## 2020-09-01 DIAGNOSIS — I2510 Atherosclerotic heart disease of native coronary artery without angina pectoris: Secondary | ICD-10-CM

## 2020-09-01 DIAGNOSIS — I351 Nonrheumatic aortic (valve) insufficiency: Secondary | ICD-10-CM

## 2020-09-01 DIAGNOSIS — I10 Essential (primary) hypertension: Secondary | ICD-10-CM | POA: Diagnosis not present

## 2020-09-01 HISTORY — DX: Nonrheumatic aortic (valve) insufficiency: I35.1

## 2020-09-01 NOTE — Progress Notes (Signed)
Cardiology Office Note:    Date:  09/01/2020   ID:  Kristine Valdez, DOB [birth year], MRN [id number]  PCP:  Kaleen Mask, MD  Cardiologist:  Garwin Brothers, MD   Referring MD: Kaleen Mask, *    ASSESSMENT:    1. Coronary artery disease involving native coronary artery of native heart without angina pectoris   2. Essential hypertension   3. Aortic valve insufficiency, etiology of cardiac valve disease unspecified    PLAN:    In order of problems listed above:  1. Coronary artery disease: Secondary prevention stressed with the patient.  Importance of compliance with diet medication stressed and she vocalized understanding.  She ambulates age appropriately.  She is not keen on lipid-lowering medications I respect her wishes. 2. Essential hypertension: Blood pressure stable and diet was emphasized.  Her family checks her blood pressure at home.  She has an element of whitecoat hypertension. 3. Moderate aortic regurgitation: Stable and asymptomatic at this time.  We will continue to monitor. 4. Patient will be seen in follow-up appointment in 6 months or earlier if the patient has any concerns    Medication Adjustments/Labs and Tests Ordered: Current medicines are reviewed at length with the patient today.  Concerns regarding medicines are outlined above.  No orders of the defined types were placed in this encounter.  No orders of the defined types were placed in this encounter.    No chief complaint on file.    History of Present Illness:    Kristine Valdez is a 86 y.o. female.  Patient has past medical history of coronary artery disease, essential hypertension and aortic regurgitation.  She denies any problems at this time and takes care of activities of daily living.  She looks younger than stated age.  Her daughter brings her for this visit.  At the time of my evaluation, the patient is alert awake oriented and in no distress.  Past Medical History:    Diagnosis Date  . Acute on chronic systolic congestive heart failure (HCC) 12/01/2011  . Bilateral carotid bruits 11/21/2018  . CAD (coronary artery disease)    Cardiac cath 12/02/11  Short LM, patent LAD stent with 50% ISR, 95% ostial jailed diagonal stenosis, dominant circumflex with no significant disease, nondominant RCA with calcified ostium                        2007   Anterior MI                         2007  DES to LAD for occluded LAD at site of prior J and J stent   . Cataract, nuclear 08/22/2012  . CHF (congestive heart failure) (HCC)   . Chronic systolic congestive heart failure, NYHA class 2 (HCC)   . Coronary artery disease   . Essential hypertension 03/05/2020  . Hyperlipidemia   . Hypertension   . Hypertensive heart disease without CHF   . Macular retinal puckering, right eye 12/01/2015  . Nuclear sclerosis of both eyes 04/08/2018  . Old anterior myocardial infarction   . Open angle with borderline findings, low risk 08/22/2012  . Open angle with borderline findings, low risk, bilateral 12/01/2015  . Posterior vitreous detachment, right 04/03/2014    Past Surgical History:  Procedure Laterality Date  . ABDOMINAL HYSTERECTOMY    . LEFT HEART CATHETERIZATION WITH CORONARY ANGIOGRAM N/A 12/02/2011   Procedure: LEFT HEART CATHETERIZATION  WITH CORONARY ANGIOGRAM;  Surgeon: Othella Boyer, MD;  Location: [facility] C[facility] LAB;  Service: Cardiovascular;  Laterality: N/A;    Current Medications: Current Meds  Medication Sig  . acetaminophen (TYLENOL) 325 MG tablet Take 650 mg by mouth every 6 (six) hours as needed for mild pain.  . candesartan (ATACAND) 16 MG tablet Take 2 tablets (32 mg total) by mouth daily. (Patient taking differently: Take 16 mg by mouth daily. )  . candesartan (ATACAND) 16 MG tablet Take 16 mg by mouth daily.  . clopidogrel (PLAVIX) 75 MG tablet Take 1 tablet (75 mg total) by mouth daily.  . furosemide (LASIX) 40 MG tablet TAKE 1 TABLET BY MOUTH DAILY  . metoprolol  tartrate (LOPRESSOR) 100 MG tablet Take 100 mg by mouth daily.  Marland Kitchen VITAMIN D PO Take by mouth.     Allergies:   Cortisone, Penicillins, Statins, Ace inhibitors, Codeine, and Hydrocodone-acetaminophen   Social History   Socioeconomic History  . Marital status: Widowed    Spouse name: Not on file  . Number of children: Not on file  . Years of education: Not on file  . Highest education level: Not on file  Occupational History  . Not on file  Tobacco Use  . Smoking status: Never Smoker  . Smokeless tobacco: Never Used  Substance and Sexual Activity  . Alcohol use: No  . Drug use: No  . Sexual activity: Never  Other Topics Concern  . Not on file  Social History Narrative   Widow.  Lives alone   Social Determinants of Health   Financial Resource Strain:   . Difficulty of Paying Living Expenses: Not on file  Food Insecurity:   . Worried About Programme researcher, broadcasting/film/video in the Last Year: Not on file  . Ran Out of Food in the Last Year: Not on file  Transportation Needs:   . Lack of Transportation (Medical): Not on file  . Lack of Transportation (Non-Medical): Not on file  Physical Activity:   . Days of Exercise per Week: Not on file  . Minutes of Exercise per Session: Not on file  Stress:   . Feeling of Stress : Not on file  Social Connections:   . Frequency of Communication with Friends and Family: Not on file  . Frequency of Social Gatherings with Friends and Family: Not on file  . Attends Religious Services: Not on file  . Active Member of Clubs or Organizations: Not on file  . Attends Banker Meetings: Not on file  . Marital Status: Not on file     Family History: The patient's family history includes Breast cancer in her sister; Cancer in her brother; Coronary artery disease in her brother; Heart attack in her father; Heart failure in her mother.  ROS:   Please see the history of present illness.    All other systems reviewed and are  negative.  EKGs/Labs/Other Studies Reviewed:    The following studies were reviewed today: EKG reveals sinus rhythm left axis deviation and left ventricular hypertrophy IMPRESSIONS    1. The left ventricle has normal systolic function of 60-65%. The cavity  size is normal. There is no increased left ventricular wall thickness.  Echo evidence of impaired diastolic relaxation.  2. Moderately dilated left atrial size.  3. The mitral valve is normal in structure.  4. The aortic valve is normal in structure and function.  5. Moderate aortic regurgitation.    Recent Labs: 03/05/2020: BUN 18; Creatinine, Ser  1.18; Potassium 4.5; Sodium 137; TSH 1.530  Recent Lipid Panel    Component Value Date/Time   CHOL 236 (H) 12/04/2018 0952   TRIG 91 12/04/2018 0952   HDL 86 12/04/2018 0952   CHOLHDL 2.7 12/04/2018 0952   CHOLHDL 2.6 12/02/2011 0940   VLDL 14 12/02/2011 0940   LDLCALC 132 (H) 12/04/2018 0952    Physical Exam:    VS:  BP (!) 178/81   Pulse 79   Ht 5\' 7"  (1.702 m)   Wt 110 lb 12.8 oz (50.3 kg)   SpO2 98%   BMI 17.35 kg/m     Wt Readings from Last 3 Encounters:  09/01/20 110 lb 12.8 oz (50.3 kg)  03/05/20 114 lb (51.7 kg)  07/22/19 111 lb (50.3 kg)     GEN: Patient is in no acute distress HEENT: Normal NECK: No JVD; No carotid bruits LYMPHATICS: No lymphadenopathy CARDIAC: Hear sounds regular, 2/6 systolic murmur at the apex. RESPIRATORY:  Clear to auscultation without rales, wheezing or rhonchi  ABDOMEN: Soft, non-tender, non-distended MUSCULOSKELETAL:  No edema; No deformity  SKIN: Warm and dry NEUROLOGIC:  Alert and oriented x 3 PSYCHIATRIC:  Normal affect   Signed, 07/24/19, MD  09/01/2020 11:30 AM    [facility]

## 2020-09-01 NOTE — Patient Instructions (Signed)

## 2020-10-19 ENCOUNTER — Telehealth: Payer: Self-pay

## 2020-10-19 NOTE — Telephone Encounter (Signed)
PA started on [facility] for Candesartan Cilexetil 16 mg.  Key:  [id number]

## 2020-10-20 NOTE — Telephone Encounter (Signed)
PA not required for Candesartan Cilexetil 16 mg, medication is covered on patient's insurance plan per [facility].

## 2021-01-18 ENCOUNTER — Other Ambulatory Visit: Payer: Self-pay | Admitting: Cardiology

## 2021-01-18 NOTE — Telephone Encounter (Signed)
Refill sent to pharmacy.   

## 2021-02-04 ENCOUNTER — Other Ambulatory Visit: Payer: Self-pay | Admitting: Cardiology

## 2021-02-05 NOTE — Telephone Encounter (Signed)
Atacand 16 mg refill to pharmacy, message to patient needs appointment for future refill.

## 2021-03-25 ENCOUNTER — Ambulatory Visit
Admission: RE | Admit: 2021-03-25 | Discharge: 2021-03-25 | Disposition: A | Discharge status: Home / Self Care | Payer: Medicare Other | Source: Ambulatory Visit | Attending: Family Medicine | Admitting: Family Medicine

## 2021-03-25 ENCOUNTER — Other Ambulatory Visit: Payer: Self-pay | Admitting: Family Medicine

## 2021-03-25 ENCOUNTER — Other Ambulatory Visit: Payer: Self-pay

## 2021-03-25 DIAGNOSIS — R634 Abnormal weight loss: Secondary | ICD-10-CM

## 2021-03-25 DIAGNOSIS — M7060 Trochanteric bursitis, unspecified hip: Secondary | ICD-10-CM

## 2021-06-16 DIAGNOSIS — H25813 Combined forms of age-related cataract, bilateral: Secondary | ICD-10-CM | POA: Insufficient documentation

## 2021-06-16 HISTORY — DX: Combined forms of age-related cataract, bilateral: H25.813

## 2021-06-18 DIAGNOSIS — I2510 Atherosclerotic heart disease of native coronary artery without angina pectoris: Secondary | ICD-10-CM | POA: Insufficient documentation

## 2021-06-18 HISTORY — DX: Atherosclerotic heart disease of native coronary artery without angina pectoris: I25.10

## 2021-08-23 ENCOUNTER — Other Ambulatory Visit: Payer: Self-pay

## 2021-08-25 ENCOUNTER — Other Ambulatory Visit: Payer: Self-pay

## 2021-08-25 ENCOUNTER — Ambulatory Visit: Payer: Medicare Other | Admitting: Cardiology

## 2021-08-25 ENCOUNTER — Encounter: Payer: Self-pay | Admitting: Cardiology

## 2021-08-25 VITALS — BP 184/86 | HR 82 | Ht 67.0 in | Wt 105.8 lb

## 2021-08-25 DIAGNOSIS — I10 Essential (primary) hypertension: Secondary | ICD-10-CM | POA: Diagnosis not present

## 2021-08-25 DIAGNOSIS — I2510 Atherosclerotic heart disease of native coronary artery without angina pectoris: Secondary | ICD-10-CM

## 2021-08-25 DIAGNOSIS — I351 Nonrheumatic aortic (valve) insufficiency: Secondary | ICD-10-CM | POA: Diagnosis not present

## 2021-08-25 DIAGNOSIS — E782 Mixed hyperlipidemia: Secondary | ICD-10-CM

## 2021-08-25 MED ORDER — CANDESARTAN CILEXETIL 16 MG PO TABS: 16.0000 mg | ORAL_TABLET | Freq: Every day | ORAL | 3 refills | Status: DC

## 2021-08-25 NOTE — Progress Notes (Signed)
Cardiology Office Note:    Date:  08/25/2021   ID:  Kristine Valdez, DOB August 14, 1926, MRN [id number]  PCP:  Kaleen Mask, MD  Cardiologist:  Garwin Brothers, MD   Referring MD: Kaleen Mask, *    ASSESSMENT:    1. Coronary artery disease involving native coronary artery of native heart without angina pectoris   2. Aortic valve insufficiency, etiology of cardiac valve disease unspecified   3. Essential hypertension    PLAN:    In order of problems listed above:  Coronary artery disease: Secondary prevention stressed with the patient.  Importance of compliance with diet medication stressed and she vocalized understanding.  Patient was advised to keep herself active and ambulate age appropriately. Essential hypertension: Blood pressure stable and diet was emphasized.  It appears that she has an element of whitecoat hypertension.  She is going to keep a track of her blood pressures and take blood pressure at least an hour after she takes medications.  She will send me the measurements for the readings via MyChart and I will review and get back to [place].  Her family member mentions to me that she indulges in salt significantly and patient promises to do better. Mixed dyslipidemia: Lipids will be reviewed she will have blood work done today. Moderate aortic regurgitation: We will do echocardiogram to evaluate this.  Last echocardiogram was discussed with patient. Patient will be seen in follow-up appointment in 6 months or earlier if the patient has any concerns    Medication Adjustments/Labs and Tests Ordered: Current medicines are reviewed at length with the patient today.  Concerns regarding medicines are outlined above.  No orders of the defined types were placed in this encounter.  No orders of the defined types were placed in this encounter.    No chief complaint on file.    History of Present Illness:    Kristine Valdez is a 87 y.o. female.  Patient has past  medical history of coronary artery disease, essential hypertension and dyslipidemia.  She denies any problems at this time and takes care of activities of daily living.  Family member accompanies her for this visit.  Patient mentions to me that her blood pressure is elevated at home and she is worried about it.  Past Medical History:  Diagnosis Date   Acute on chronic systolic congestive heart failure (HCC) 12/01/2011   Aortic regurgitation 09/01/2020   Arteriosclerosis of coronary artery 06/18/2021   Formatting of this note might be different from the original. Formatting of this note might be different from the original. Cardiac cath 12/02/11  Short LM, patent LAD stent with 50% ISR, 95% ostial jailed diagonal stenosis, dominant circumflex with no significant disease, nondominant RCA with calcified ostium                        2007   Anterior MI                         2007  DES to LAD for occ   Bilateral carotid bruits 11/21/2018   CAD (coronary artery disease)    Cardiac cath 12/02/11  Short LM, patent LAD stent with 50% ISR, 95% ostial jailed diagonal stenosis, dominant circumflex with no significant disease, nondominant RCA with calcified ostium                        2007  Anterior MI                         2007  DES to LAD for occluded LAD at site of prior J and J stent    Cataract, nuclear 08/22/2012   CHF (congestive heart failure) (HCC)    Chronic systolic congestive heart failure, NYHA class 2 (HCC)    Combined form of 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 Added automatically from request for surgery [id number]   Coronary artery disease    Essential hypertension 03/05/2020   Hyperlipidemia    Hypertension    Hypertensive heart disease without CHF    Macular retinal puckering, right eye 12/01/2015   Nuclear sclerosis of both eyes 04/08/2018   Old anterior myocardial infarction    Open angle with borderline findings, low risk  08/22/2012   Open angle with borderline findings, low risk, bilateral 12/01/2015   Posterior vitreous detachment, right 04/03/2014    Past Surgical History:  Procedure Laterality Date   ABDOMINAL HYSTERECTOMY     LEFT HEART CATHETERIZATION WITH CORONARY ANGIOGRAM N/A 12/02/2011   Procedure: LEFT HEART CATHETERIZATION WITH CORONARY ANGIOGRAM;  Surgeon: Othella Boyer, MD;  Location: [facility] CATH LAB;  Service: Cardiovascular;  Laterality: N/A;    Current Medications: Current Meds  Medication Sig   acetaminophen (TYLENOL) 325 MG tablet Take 650 mg by mouth every 6 (six) hours as needed for mild pain.   candesartan (ATACAND) 16 MG tablet Take 16 mg by mouth daily.   clopidogrel (PLAVIX) 75 MG tablet Take 1 tablet (75 mg total) by mouth daily.   furosemide (LASIX) 40 MG tablet TAKE 1 TABLET BY MOUTH DAILY   metoprolol tartrate (LOPRESSOR) 100 MG tablet Take 100 mg by mouth daily.   nitroGLYCERIN (NITROSTAT) 0.4 MG SL tablet Place 0.4 mg under the tongue every 5 (five) minutes as needed for chest pain.   VITAMIN D PO Take 1 capsule by mouth once a week.     Allergies:   Cortisone, Penicillins, Statins, Ace inhibitors, Codeine, and Hydrocodone-acetaminophen   Social History   Socioeconomic History   Marital status: Widowed    Spouse name: Not on file   Number of children: Not on file   Years of education: Not on file   Highest education level: Not on file  Occupational History   Not on file  Tobacco Use   Smoking status: Never   Smokeless tobacco: Never  Substance and Sexual Activity   Alcohol use: No   Drug use: No   Sexual activity: Never  Other Topics Concern   Not on file  Social History Narrative   Widow.  Lives alone   Social Determinants of Health   Financial Resource Strain: Not on file  Food Insecurity: Not on file  Transportation Needs: Not on file  Physical Activity: Not on file  Stress: Not on file  Social Connections: Not on file     Family History: The  patient's family history includes Breast cancer in her sister; Cancer in her brother; Coronary artery disease in her brother; Heart attack in her father; Heart failure in her mother.  ROS:   Please see the history of present illness.    All other systems reviewed and are negative.  EKGs/Labs/Other Studies Reviewed:    The following studies were reviewed today: IMPRESSIONS     1. The left ventricle has normal systolic function of 60-65%.  The cavity  size is normal. There is no increased left ventricular wall thickness.  Echo evidence of impaired diastolic relaxation.   2. Moderately dilated left atrial size.   3. The mitral valve is normal in structure.   4. The aortic valve is normal in structure and function.   5. Moderate aortic regurgitation.    Recent Labs: No results found for requested labs within last 8760 hours.  Recent Lipid Panel    Component Value Date/Time   CHOL 236 (H) 12/04/2018 0952   TRIG 91 12/04/2018 0952   HDL 86 12/04/2018 0952   CHOLHDL 2.7 12/04/2018 0952   CHOLHDL 2.6 12/02/2011 0940   VLDL 14 12/02/2011 0940   LDLCALC 132 (H) 12/04/2018 0952    Physical Exam:    VS:  BP (!) 184/86   Pulse 82   Ht 5\' 7"  (1.702 m)   Wt 105 lb 12.8 oz (48 kg)   SpO2 99%   BMI 16.57 kg/m     Wt Readings from Last 3 Encounters:  08/25/21 105 lb 12.8 oz (48 kg)  09/01/20 110 lb 12.8 oz (50.3 kg)  03/05/20 114 lb (51.7 kg)     GEN: Patient is in no acute distress HEENT: Normal NECK: No JVD; No carotid bruits LYMPHATICS: No lymphadenopathy CARDIAC: Hear sounds regular, 2/6 systolic murmur at the apex. RESPIRATORY:  Clear to auscultation without rales, wheezing or rhonchi  ABDOMEN: Soft, non-tender, non-distended MUSCULOSKELETAL:  No edema; No deformity  SKIN: Warm and dry NEUROLOGIC:  Alert and oriented x 3 PSYCHIATRIC:  Normal affect   Signed, 05/05/20, MD  08/25/2021 8:40 AM    [facility]

## 2021-08-25 NOTE — Patient Instructions (Addendum)
Medication Instructions:  Your physician recommends that you continue on your current medications as directed. Please refer to the Current Medication list given to you today.  *If you need a refill on your cardiac medications before your next appointment, please call your pharmacy*   Lab Work: Your physician recommends that you have labs done in the office today. Your test included  basic metabolic panel, complete blood count, TSH, liver function and lipids.  If you have labs (blood work) drawn today and your tests are completely normal, you will receive your results only by: MyChart Message (if you have MyChart) OR A paper copy in the mail If you have any lab test that is abnormal or we need to change your treatment, we will call you to review the results.   Testing/Procedures: Your physician has requested that you have an echocardiogram. Echocardiography is a painless test that uses sound waves to create images of your heart. It provides your doctor with information about the size and shape of your heart and how well your heart's chambers and valves are working. This procedure takes approximately one hour. There are no restrictions for this procedure.    Follow-Up: At [facility], you and your health needs are our priority.  As part of our continuing mission to provide you with exceptional heart care, we have created designated Provider Care Teams.  These Care Teams include your primary Cardiologist (physician) and Advanced Practice Providers (APPs -  Physician Assistants and Nurse Practitioners) who all work together to provide you with the care you need, when you need it.  We recommend signing up for the patient portal called "MyChart".  Sign up information is provided on this After Visit Summary.  MyChart is used to connect with patients for Virtual Visits (Telemedicine).  Patients are able to view lab/test results, encounter notes, upcoming appointments, etc.  Non-urgent messages can  be sent to your provider as well.   To learn more about what you can do with MyChart, go to [email or web address].    Your next appointment:   6 month(s)  The format for your next appointment:   In Person  Provider:   Belva Crome, MD   Other Instructions Echocardiogram An echocardiogram is a test that uses sound waves (ultrasound) to produce images of the heart. Images from an echocardiogram can provide important information about: Heart size and shape. The size and thickness and movement of your heart's walls. Heart muscle function and strength. Heart valve function or if you have stenosis. Stenosis is when the heart valves are too narrow. If blood is flowing backward through the heart valves (regurgitation). A tumor or infectious growth around the heart valves. Areas of heart muscle that are not working well because of poor blood flow or injury from a heart attack. Aneurysm detection. An aneurysm is a weak or damaged part of an artery wall. The wall bulges out from the normal force of blood pumping through the body. Tell a health care provider about: Any allergies you have. All medicines you are taking, including vitamins, herbs, eye drops, creams, and over-the-counter medicines. Any blood disorders you have. Any surgeries you have had. Any medical conditions you have. Whether you are pregnant or may be pregnant. What are the risks? Generally, this is a safe test. However, problems may occur, including an allergic reaction to dye (contrast) that may be used during the test. What happens before the test? No specific preparation is needed. You may eat and drink normally. What happens during  the test? You will take off your clothes from the waist up and put on a hospital gown. Electrodes or electrocardiogram (ECG)patches may be placed on your chest. The electrodes or patches are then connected to a device that monitors your heart rate and rhythm. You will lie down on a  table for an ultrasound exam. A gel will be applied to your chest to help sound waves pass through your skin. A handheld device, called a transducer, will be pressed against your chest and moved over your heart. The transducer produces sound waves that travel to your heart and bounce back (or "echo" back) to the transducer. These sound waves will be captured in real-time and changed into images of your heart that can be viewed on a video monitor. The images will be recorded on a computer and reviewed by your health care provider. You may be asked to change positions or hold your breath for a short time. This makes it easier to get different views or better views of your heart. In some cases, you may receive contrast through an IV in one of your veins. This can improve the quality of the pictures from your heart. The procedure may vary among health care providers and hospitals.   What can I expect after the test? You may return to your normal, everyday life, including diet, activities, and medicines, unless your health care provider tells you not to do that. Follow these instructions at home: It is up to you to get the results of your test. Ask your health care provider, or the department that is doing the test, when your results will be ready. Keep all follow-up visits. This is important. Summary An echocardiogram is a test that uses sound waves (ultrasound) to produce images of the heart. Images from an echocardiogram can provide important information about the size and shape of your heart, heart muscle function, heart valve function, and other possible heart problems. You do not need to do anything to prepare before this test. You may eat and drink normally. After the echocardiogram is completed, you may return to your normal, everyday life, unless your health care provider tells you not to do that. This information is not intended to replace advice given to you by your health care provider. Make  sure you discuss any questions you have with your health care provider. Document Revised: 06/09/2020 Document Reviewed: 06/09/2020 Elsevier Patient Education  2021 Elsevier Inc.   Blood Pressure Record Sheet To take your blood pressure, you will need a blood pressure machine. You can buy a blood pressure machine (blood pressure monitor) at your clinic, drug store, or online. When choosing one, consider: An automatic monitor that has an arm cuff. A cuff that wraps snugly around your upper arm. You should be able to fit only one finger between your arm and the cuff. A device that stores blood pressure reading results. Do not choose a monitor that measures your blood pressure from your wrist or finger. Follow your health care provider's instructions for how to take your blood pressure. To use this form: Get one reading in the morning (a.m.) 1-2 hours after you take any medicines. Get one reading in the evening (p.m.) before supper. Take at least 2 readings with each blood pressure check. This makes sure the results are correct. Wait 1-2 minutes between measurements. Write down the results in the spaces on this form. Repeat this once a week, or as told by your health care provider.  Make a follow-up  appointment with your health care provider to discuss the results. Blood pressure log Date: _______________________ a.m. _____________________(1st reading) HR___________            p.m. _____________________(2nd reading) HR__________  Date: _______________________ a.m. _____________________(1st reading) HR___________            p.m. _____________________(2nd reading) HR__________ Date: _______________________ a.m. _____________________(1st reading) HR___________            p.m. _____________________(2nd reading) HR__________ Date: _______________________ a.m. _____________________(1st reading) HR___________            p.m. _____________________(2nd reading) HR__________  Date:  _______________________ a.m. _____________________(1st reading) HR___________            p.m. _____________________(2nd reading) HR__________  Date: _______________________ a.m. _____________________(1st reading) HR___________            p.m. _____________________(2nd reading) HR__________  Date: _______________________ a.m. _____________________(1st reading) HR___________            p.m. _____________________(2nd reading) HR__________   This information is not intended to replace advice given to you by your health care provider. Make sure you discuss any questions you have with your health care provider. Document Revised: 02/05/2020 Document Reviewed: 02/05/2020 Elsevier Patient Education  2021 [employer].

## 2021-08-26 LAB — CBC WITH DIFFERENTIAL/PLATELET
Basophils Absolute: 0.1 10*3/uL (ref 0.0–0.2)
Basos: 1 %
EOS (ABSOLUTE): 0.1 10*3/uL (ref 0.0–0.4)
Eos: 2 %
Hematocrit: 39.9 % (ref 34.0–46.6)
Hemoglobin: 13 g/dL (ref 11.1–15.9)
Immature Grans (Abs): 0 10*3/uL (ref 0.0–0.1)
Immature Granulocytes: 0 %
Lymphocytes Absolute: 1.3 10*3/uL (ref 0.7–3.1)
Lymphs: 20 %
MCH: 31.6 pg (ref 26.6–33.0)
MCHC: 32.6 g/dL (ref 31.5–35.7)
MCV: 97 fL (ref 79–97)
Monocytes Absolute: 0.7 10*3/uL (ref 0.1–0.9)
Monocytes: 10 %
Neutrophils Absolute: 4.2 10*3/uL (ref 1.4–7.0)
Neutrophils: 67 %
Platelets: 261 10*3/uL (ref 150–450)
RBC: 4.12 x10E6/uL (ref 3.77–5.28)
RDW: 12.1 % (ref 11.7–15.4)
WBC: 6.4 10*3/uL (ref 3.4–10.8)

## 2021-08-26 LAB — HEPATIC FUNCTION PANEL
ALT: 13 IU/L (ref 0–32)
AST: 17 IU/L (ref 0–40)
Albumin: 4 g/dL (ref 3.5–4.6)
Alkaline Phosphatase: 81 IU/L (ref 44–121)
Bilirubin Total: 0.7 mg/dL (ref 0.0–1.2)
Bilirubin, Direct: 0.19 mg/dL (ref 0.00–0.40)
Total Protein: 7.3 g/dL (ref 6.0–8.5)

## 2021-08-26 LAB — BASIC METABOLIC PANEL
BUN/Creatinine Ratio: 19 (ref 12–28)
BUN: 21 mg/dL (ref 10–36)
CO2: 25 mmol/L (ref 20–29)
Calcium: 10.1 mg/dL (ref 8.7–10.3)
Chloride: 100 mmol/L (ref 96–106)
Creatinine, Ser: 1.08 mg/dL — ABNORMAL HIGH (ref 0.57–1.00)
Glucose: 101 mg/dL — ABNORMAL HIGH (ref 70–99)
Potassium: 4.2 mmol/L (ref 3.5–5.2)
Sodium: 139 mmol/L (ref 134–144)
eGFR: 48 mL/min/{1.73_m2} — ABNORMAL LOW (ref >59)

## 2021-08-26 LAB — LIPID PANEL
Chol/HDL Ratio: 2.6 ratio (ref 0.0–4.4)
Cholesterol, Total: 250 mg/dL — ABNORMAL HIGH (ref 100–199)
HDL: 97 mg/dL (ref >39)
LDL Chol Calc (NIH): 135 mg/dL — ABNORMAL HIGH (ref 0–99)
Triglycerides: 108 mg/dL (ref 0–149)
VLDL Cholesterol Cal: 18 mg/dL (ref 5–40)

## 2021-08-26 LAB — TSH: TSH: 1.63 u[IU]/mL (ref 0.450–4.500)

## 2021-08-31 ENCOUNTER — Encounter (HOSPITAL_COMMUNITY): Payer: Self-pay

## 2021-08-31 ENCOUNTER — Emergency Department (HOSPITAL_COMMUNITY)
Admission: EM | Admit: 2021-08-31 | Discharge: 2021-08-31 | Disposition: A | Discharge status: Home / Self Care | Payer: Medicare Other | Attending: Emergency Medicine | Admitting: Emergency Medicine

## 2021-08-31 ENCOUNTER — Emergency Department (HOSPITAL_COMMUNITY): Payer: Medicare Other

## 2021-08-31 DIAGNOSIS — I509 Heart failure, unspecified: Secondary | ICD-10-CM | POA: Insufficient documentation

## 2021-08-31 DIAGNOSIS — Z5321 Procedure and treatment not carried out due to patient leaving prior to being seen by health care provider: Secondary | ICD-10-CM | POA: Diagnosis not present

## 2021-08-31 DIAGNOSIS — R0602 Shortness of breath: Secondary | ICD-10-CM | POA: Insufficient documentation

## 2021-08-31 DIAGNOSIS — I110 Hypertensive heart disease with heart failure: Secondary | ICD-10-CM | POA: Insufficient documentation

## 2021-08-31 DIAGNOSIS — F419 Anxiety disorder, unspecified: Secondary | ICD-10-CM | POA: Diagnosis not present

## 2021-08-31 DIAGNOSIS — R630 Anorexia: Secondary | ICD-10-CM | POA: Diagnosis not present

## 2021-08-31 LAB — BASIC METABOLIC PANEL
Anion gap: 6 (ref 5–15)
BUN: 13 mg/dL (ref 8–23)
CO2: 25 mmol/L (ref 22–32)
Calcium: 9 mg/dL (ref 8.9–10.3)
Chloride: 97 mmol/L — ABNORMAL LOW (ref 98–111)
Creatinine, Ser: 0.97 mg/dL (ref 0.44–1.00)
GFR, Estimated: 54 mL/min — ABNORMAL LOW (ref >60)
Glucose, Bld: 126 mg/dL — ABNORMAL HIGH (ref 70–99)
Potassium: 3.9 mmol/L (ref 3.5–5.1)
Sodium: 128 mmol/L — ABNORMAL LOW (ref 135–145)

## 2021-08-31 LAB — CBC
HCT: 41.5 % (ref 36.0–46.0)
Hemoglobin: 13.8 g/dL (ref 12.0–15.0)
MCH: 33.1 pg (ref 26.0–34.0)
MCHC: 33.3 g/dL (ref 30.0–36.0)
MCV: 99.5 fL (ref 80.0–100.0)
Platelets: 244 10*3/uL (ref 150–400)
RBC: 4.17 MIL/uL (ref 3.87–5.11)
RDW: 13.3 % (ref 11.5–15.5)
WBC: 7.9 10*3/uL (ref 4.0–10.5)
nRBC: 0 % (ref 0.0–0.2)

## 2021-08-31 LAB — HEPATIC FUNCTION PANEL
ALT: 42 U/L (ref 0–44)
AST: 31 U/L (ref 15–41)
Albumin: 3.6 g/dL (ref 3.5–5.0)
Alkaline Phosphatase: 74 U/L (ref 38–126)
Bilirubin, Direct: 0.2 mg/dL (ref 0.0–0.2)
Indirect Bilirubin: 1 mg/dL — ABNORMAL HIGH (ref 0.3–0.9)
Total Bilirubin: 1.2 mg/dL (ref 0.3–1.2)
Total Protein: 7.4 g/dL (ref 6.5–8.1)

## 2021-08-31 LAB — TROPONIN I (HIGH SENSITIVITY): Troponin I (High Sensitivity): 14 ng/L (ref <18)

## 2021-08-31 NOTE — ED Triage Notes (Addendum)
Pt brought in by daughter. Daughter reports pt had anxiety about 3 weeks ago after having cataract surgery and thought the eye drops caused her anxiety. Anxiety has been worse lately and pt stresses out about her high blood pressure in the morning.   Pt is currently seeing a cardiologist who wanted her to take a log of her Bps before he added any BP meds.   Also c/o shob with walking. Loss of appetite and not sleeping.   Hx: CHF  A/O Ambulatory in triage.  Reports blood work done on the 20th and was told everything looked good.

## 2021-08-31 NOTE — ED Provider Notes (Signed)
Emergency Medicine Provider Triage Evaluation Note  ALEESHA RINGSTAD , a 87 y.o. female  was evaluated in triage.  Pt complains of anxiety some dyspnea and hypertension.  Patient has been having issues with feeling anxious ever since having cataract surgery.  Patient felt the symptoms could have been related to her eyedrops.  Her blood pressure has been running high so she followed up with her cardiologist.  Patient was seen 5 days ago.  Feeling was that her blood pressure could be related to stress.  Plan was to have her take a record of her blood pressure before adjusting in her medications.  Patient is also scheduled for an echocardiogram is coming up later this week.  Patient states she has continued to feel stressed about her blood pressure.  She has not been eating or drinking as well.  She has been feeling more anxious.  She is not having chest pain but she does get more short of breath with activity Review of Systems  Positive: Stress and anxiety Negative: No fevers no chest pain  Physical Exam  BP (!) 170/107 (BP Location: Right Arm)   Pulse 82   Temp 99.1 F (37.3 C) (Oral)   Resp 16   SpO2 96%  Gen:   Awake, no distress   Resp:  Normal effort, lungs are clear to auscultation MSK:   Moves extremities without difficulty no edema noted Other:  Calm, cooperative  Medical Decision Making  Medically screening exam initiated at 3:12 PM.  Appropriate orders placed.  [name] was informed that the remainder of the evaluation will be completed by another provider, this initial triage assessment does not replace that evaluation, and the importance of remaining in the ED until their evaluation is complete.  We will plan on labs including cardiac enzymes.  Chest x-ray ordered.   Linwood Dibbles, MD 08/31/21 [id number]

## 2021-09-03 ENCOUNTER — Other Ambulatory Visit: Payer: Self-pay

## 2021-09-03 ENCOUNTER — Ambulatory Visit (INDEPENDENT_AMBULATORY_CARE_PROVIDER_SITE_OTHER): Payer: Medicare Other

## 2021-09-03 DIAGNOSIS — I2510 Atherosclerotic heart disease of native coronary artery without angina pectoris: Secondary | ICD-10-CM

## 2021-09-03 DIAGNOSIS — I10 Essential (primary) hypertension: Secondary | ICD-10-CM

## 2021-09-03 DIAGNOSIS — I351 Nonrheumatic aortic (valve) insufficiency: Secondary | ICD-10-CM | POA: Diagnosis not present

## 2021-09-03 LAB — ECHOCARDIOGRAM COMPLETE
Area-P 1/2: 3.97 cm2
Calc EF: 40.7 %
MV M vel: 5.6 m/s
MV Peak grad: 125.4 mmHg
P 1/2 time: 395 msec
Radius: 0.7 cm
S' Lateral: 3.7 cm
Single Plane A2C EF: 40.3 %
Single Plane A4C EF: 30.4 %

## 2021-09-06 ENCOUNTER — Telehealth: Payer: Self-pay

## 2021-09-06 NOTE — Telephone Encounter (Signed)
Spoke to the patients daughter just now and got the patient scheduled per Dr. Kem Parkinson recommendation.    Encouraged patient to call back with any questions or concerns.

## 2021-09-06 NOTE — Telephone Encounter (Signed)
Spoke to the patient just now and she let me know that she would have her daughter to call us to schedule an appointment as she brings her to the doctor. I told her that this was fine.   She is going to check her blood pressure daily for [place] and will let us know these readings next week.    Encouraged patient to call back with any questions or concerns.

## 2021-09-06 NOTE — Telephone Encounter (Signed)
-----   Message from Garwin Brothers, MD sent at 09/03/2021  1:26 PM EDT ----- Her ejection fraction is moderately depressed.  Please give her an appointment to see me in the next couple of weeks.  Please have her keep a blood pressure log at home and let me review it,  midweek next week copy primary care Garwin Brothers, MD 09/03/2021 1:26 PM

## 2021-09-06 NOTE — Telephone Encounter (Signed)
Patient's daughter calling back. I did not see anything until January.

## 2021-09-22 ENCOUNTER — Encounter: Payer: Self-pay | Admitting: Cardiology

## 2021-09-22 ENCOUNTER — Other Ambulatory Visit: Payer: Self-pay

## 2021-09-22 ENCOUNTER — Ambulatory Visit: Payer: Medicare Other | Admitting: Cardiology

## 2021-09-22 VITALS — BP 160/72 | HR 68 | Ht 67.0 in | Wt 104.5 lb

## 2021-09-22 DIAGNOSIS — I2510 Atherosclerotic heart disease of native coronary artery without angina pectoris: Secondary | ICD-10-CM

## 2021-09-22 DIAGNOSIS — I351 Nonrheumatic aortic (valve) insufficiency: Secondary | ICD-10-CM

## 2021-09-22 DIAGNOSIS — Z5181 Encounter for therapeutic drug level monitoring: Secondary | ICD-10-CM

## 2021-09-22 DIAGNOSIS — Z79899 Other long term (current) drug therapy: Secondary | ICD-10-CM

## 2021-09-22 DIAGNOSIS — I5022 Chronic systolic (congestive) heart failure: Secondary | ICD-10-CM | POA: Diagnosis not present

## 2021-09-22 DIAGNOSIS — I10 Essential (primary) hypertension: Secondary | ICD-10-CM | POA: Diagnosis not present

## 2021-09-22 DIAGNOSIS — I340 Nonrheumatic mitral (valve) insufficiency: Secondary | ICD-10-CM | POA: Insufficient documentation

## 2021-09-22 HISTORY — DX: Nonrheumatic mitral (valve) insufficiency: I34.0

## 2021-09-22 MED ORDER — DIGOXIN 125 MCG PO TABS: ORAL_TABLET | ORAL | 3 refills | Status: DC

## 2021-09-22 NOTE — Patient Instructions (Signed)
Medication Instructions:  Your physician has recommended you make the following change in your medication:   Start taking Digoxin 0.125 mg on Monday, Wednesday and Friday.  *If you need a refill on your cardiac medications before your next appointment, please call your pharmacy*   Lab Work: Your physician recommends that you have a BMET today.  Your physician recommends that you return for lab work in: 2 weeks on 10/07/21. You do not need to fast. You can come Monday through Friday 8:30 am to 12:00 pm and 1:15 to 4:30. You do not need to make an appointment as the order has already been placed.    If you have labs (blood work) drawn today and your tests are completely normal, you will receive your results only by: MyChart Message (if you have MyChart) OR A paper copy in the mail If you have any lab test that is abnormal or we need to change your treatment, we will call you to review the results.   Testing/Procedures: None ordered   Follow-Up: At [facility], you and your health needs are our priority.  As part of our continuing mission to provide you with exceptional heart care, we have created designated Provider Care Teams.  These Care Teams include your primary Cardiologist (physician) and Advanced Practice Providers (APPs -  Physician Assistants and Nurse Practitioners) who all work together to provide you with the care you need, when you need it.  We recommend signing up for the patient portal called "MyChart".  Sign up information is provided on this After Visit Summary.  MyChart is used to connect with patients for Virtual Visits (Telemedicine).  Patients are able to view lab/test results, encounter notes, upcoming appointments, etc.  Non-urgent messages can be sent to your provider as well.   To learn more about what you can do with MyChart, go to [email or web address].    Your next appointment:   3 month(s)  The format for your next appointment:   In  Person  Provider:   Belva Crome, MD   Other Instructions NA

## 2021-09-22 NOTE — Progress Notes (Signed)
Cardiology Office Note:    Date:  09/22/2021   ID:  Kristine Valdez, DOB [birth year], MRN [id number]  PCP:  Kaleen Mask, MD  Cardiologist:  Garwin Brothers, MD   Referring MD: Kaleen Mask, *    ASSESSMENT:    1. Aortic valve insufficiency, etiology of cardiac valve disease unspecified   2. Arteriosclerosis of coronary artery   3. Chronic systolic congestive heart failure, NYHA class 2 (HCC)   4. Essential hypertension   5. Moderate mitral regurgitation    PLAN:    In order of problems listed above:  Coronary artery disease: Secondary prevention stressed with the patient.  Importance of compliance with diet medication stressed and she vocalized understanding. Cardiomyopathy: This is a new finding on the echocardiogram.  Patient is advanced age and has multiple medical issues and bradycardia.  I am not sure whether I would like to push guideline directed medical therapy for fear of hypotension.  I will add digoxin to her regimen 1.5 mg on Monday Wednesday and we will get a digoxin level checked on Thursday 2 weeks from now. Essential hypertension: Blood pressure stable.  She has an element of whitecoat hypertension.  Her family felt like her blood pressure sent send me a blood pressure log in 1 to 2 weeks.  I am more cautious about causing hypotension in this lady because this can have devastating effects. Mixed dyslipidemia: Patient not on statin therapy and not keen on it.  Benefits risks explained and expectations. At least regurgitation stable and we will continue to monitor.  She is on beta-blocker but heart rates are acceptable.  She is not bradycardic.  Bradycardia can have detrimental effects on aortic regurgitation and hemodynamics.  I want to optimize her blood pressure without being too aggressive in view of multiple other issues such as age. Patient will be seen in follow-up appointment in 6 months or earlier if the patient has any concerns.  Echo report was  discussed with the patient at length and questions were answered to her and her family member satisfaction.   Medication Adjustments/Labs and Tests Ordered: Current medicines are reviewed at length with the patient today.  Concerns regarding medicines are outlined above.  No orders of the defined types were placed in this encounter.  No orders of the defined types were placed in this encounter.    Chief Complaint  Patient presents with   Results     History of Present Illness:    Kristine Valdez is a 87 y.o. female.  Patient has past medical history of aortic regurgitation.  Recent ejection fraction is significantly depressed.  She has had coronary artery disease, essential hypertension and dyslipidemia.  He denies any problems at this time and takes care of activities of daily living.  She is tolerating beta-blocker well.  At the time of my evaluation, the patient is alert awake oriented and in no distress.  Past Medical History:  Diagnosis Date   Acute on chronic systolic congestive heart failure (HCC) 12/01/2011   Aortic regurgitation 09/01/2020   Arteriosclerosis of coronary artery 06/18/2021   Formatting of this note might be different from the original. Formatting of this note might be different from the original. Cardiac cath 12/02/11  Short LM, patent LAD stent with 50% ISR, 95% ostial jailed diagonal stenosis, dominant circumflex with no significant disease, nondominant RCA with calcified ostium  2007   Anterior MI                         2007  DES to LAD for occ   Bilateral carotid bruits 11/21/2018   CAD (coronary artery disease)    Cardiac cath 12/02/11  Short LM, patent LAD stent with 50% ISR, 95% ostial jailed diagonal stenosis, dominant circumflex with no significant disease, nondominant RCA with calcified ostium                        2007   Anterior MI                         2007  DES to LAD for occluded LAD at site of prior J and J stent    Cataract,  nuclear 08/22/2012   CHF (congestive heart failure) (HCC)    Chronic systolic congestive heart failure, NYHA class 2 (HCC)    Combined form of 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   Old anterior myocardial infarction    Open angle with borderline findings, low risk 08/22/2012   Open angle with borderline findings, low risk, bilateral 12/01/2015   Posterior vitreous detachment, right 04/03/2014    Past Surgical History:  Procedure Laterality Date   ABDOMINAL HYSTERECTOMY     LEFT HEART CATHETERIZATION WITH CORONARY ANGIOGRAM N/A 12/02/2011   Procedure: LEFT HEART CATHETERIZATION WITH CORONARY ANGIOGRAM;  Surgeon: Othella Boyer, MD;  Location: [facility] CATH LAB;  Service: Cardiovascular;  Laterality: N/A;    Current Medications: Current Meds  Medication Sig   acetaminophen (TYLENOL) 325 MG tablet Take 650 mg by mouth every 6 (six) hours as needed for mild pain.   candesartan (ATACAND) 16 MG tablet Take 1 tablet (16 mg total) by mouth daily.   clopidogrel (PLAVIX) 75 MG tablet Take 1 tablet (75 mg total) by mouth daily.   furosemide (LASIX) 40 MG tablet TAKE 1 TABLET BY MOUTH DAILY (Patient taking differently: Take 40 mg by mouth daily.)   metoprolol tartrate (LOPRESSOR) 100 MG tablet Take 100 mg by mouth daily.   nitroGLYCERIN (NITROSTAT) 0.4 MG SL tablet Place 0.4 mg under the tongue every 5 (five) minutes as needed for chest pain.   VITAMIN D PO Take 1 capsule by mouth once a week. Unknown strength     Allergies:   Cortisone, Penicillins, Statins, Ace inhibitors, Codeine, and Hydrocodone-acetaminophen   Social History   Socioeconomic  History   Marital status: Widowed    Spouse name: Not on file   Number of children: Not on file   Years of education: Not on file   Highest education level: Not on file  Occupational History   Not on file  Tobacco Use   Smoking status: Never   Smokeless tobacco: Never  Substance and Sexual Activity   Alcohol use: No   Drug use: No   Sexual activity: Never  Other Topics Concern   Not on file  Social History Narrative   Widow.  Lives alone   Social Determinants of Health   Financial  Resource Strain: Not on file  Food Insecurity: Not on file  Transportation Needs: Not on file  Physical Activity: Not on file  Stress: Not on file  Social Connections: Not on file     Family History: The patient's family history includes Breast cancer in her sister; Cancer in her brother; Coronary artery disease in her brother; Heart attack in her father; Heart failure in her mother.  ROS:   Please see the history of present illness.    All other systems reviewed and are negative.  EKGs/Labs/Other Studies Reviewed:    The following studies were reviewed today: EKG reveals sinus rhythm and nonspecific ST-T changes.  Left ventricular hypertrophy with poor anterior forces.   Recent Labs: 08/25/2021: TSH 1.630 08/31/2021: ALT 42; BUN 13; Creatinine, Ser 0.97; Hemoglobin 13.8; Platelets 244; Potassium 3.9; Sodium 128  Recent Lipid Panel    Component Value Date/Time   CHOL 250 (H) 08/25/2021 0858   TRIG 108 08/25/2021 0858   HDL 97 08/25/2021 0858   CHOLHDL 2.6 08/25/2021 0858   CHOLHDL 2.6 12/02/2011 0940   VLDL 14 12/02/2011 0940   LDLCALC 135 (H) 08/25/2021 0858    Physical Exam:    VS:  BP (!) 160/72 (BP Location: Left Arm, Patient Position: Sitting)   Pulse 68   Ht 5\' 7"  (1.702 m)   Wt 104 lb 8 oz (47.4 kg)   SpO2 97%   BMI 16.37 kg/m     Wt Readings from Last 3 Encounters:  09/22/21 104 lb 8 oz (47.4 kg)  08/25/21 105 lb 12.8 oz (48 kg)  09/01/20 110 lb 12.8 oz (50.3 kg)      GEN: Patient is in no acute distress HEENT: Normal NECK: No JVD; No carotid bruits LYMPHATICS: No lymphadenopathy CARDIAC: Hear sounds regular, 2/6 systolic murmur at the apex. RESPIRATORY:  Clear to auscultation without rales, wheezing or rhonchi  ABDOMEN: Soft, non-tender, non-distended MUSCULOSKELETAL:  No edema; No deformity  SKIN: Warm and dry NEUROLOGIC:  Alert and oriented x 3 PSYCHIATRIC:  Normal affect   Signed, 13/02/21, MD  09/22/2021 9:35 AM    [facility]

## 2021-09-23 LAB — BASIC METABOLIC PANEL
BUN/Creatinine Ratio: 14 (ref 12–28)
BUN: 14 mg/dL (ref 10–36)
CO2: 25 mmol/L (ref 20–29)
Calcium: 9.9 mg/dL (ref 8.7–10.3)
Chloride: 99 mmol/L (ref 96–106)
Creatinine, Ser: 1.01 mg/dL — ABNORMAL HIGH (ref 0.57–1.00)
Glucose: 72 mg/dL (ref 70–99)
Potassium: 4.4 mmol/L (ref 3.5–5.2)
Sodium: 135 mmol/L (ref 134–144)
eGFR: 52 mL/min/{1.73_m2} — ABNORMAL LOW (ref >59)

## 2021-10-08 LAB — DIGOXIN LEVEL: Digoxin, Serum: <0.4 ng/mL — ABNORMAL LOW (ref 0.5–0.9)

## 2021-11-30 DIAGNOSIS — H6122 Impacted cerumen, left ear: Secondary | ICD-10-CM

## 2021-11-30 HISTORY — DX: Impacted cerumen, left ear: H61.22

## 2021-12-23 ENCOUNTER — Encounter: Payer: Self-pay | Admitting: Cardiology

## 2021-12-23 ENCOUNTER — Ambulatory Visit: Payer: Medicare Other | Admitting: Cardiology

## 2021-12-23 ENCOUNTER — Other Ambulatory Visit: Payer: Self-pay

## 2021-12-23 VITALS — BP 182/98 | HR 112 | Ht 67.0 in | Wt 105.6 lb

## 2021-12-23 DIAGNOSIS — Z131 Encounter for screening for diabetes mellitus: Secondary | ICD-10-CM | POA: Diagnosis not present

## 2021-12-23 DIAGNOSIS — I2510 Atherosclerotic heart disease of native coronary artery without angina pectoris: Secondary | ICD-10-CM | POA: Diagnosis not present

## 2021-12-23 DIAGNOSIS — I10 Essential (primary) hypertension: Secondary | ICD-10-CM

## 2021-12-23 DIAGNOSIS — E782 Mixed hyperlipidemia: Secondary | ICD-10-CM

## 2021-12-23 NOTE — Progress Notes (Signed)
Cardiology Office Note:    Date:  12/23/2021   ID:  [name], DOB 01-03-1926, MRN [id number]  PCP:  Kaleen Mask, MD  Cardiologist:  Garwin Brothers, MD   Referring MD: Kaleen Mask, *    ASSESSMENT:    1. Coronary artery disease involving native coronary artery of native heart without angina pectoris   2. Essential hypertension   3. Mixed hyperlipidemia    PLAN:    In order of problems listed above:  Coronary artery disease: Secondary prevention stressed to the patient.  Importance of compliance with diet medication stressed she vocalized understanding.  Her blood pressure readings are fine at home.  She was advised to be staying active. Essential hypertension with an element of whitecoat hypertension: Stable at this time.  Family member mentions blood pressure readings at home and they are fine.  Diet and lifestyle modification suggested. Mixed dyslipidemia: Diet was emphasized.  Lipids were reviewed.  We will do a Chem-7 and CBC TSH and hemoglobin A1c today.  I will forward those records to her primary care. Patient will be seen in follow-up appointment in 6 months or earlier if the patient has any concerns    Medication Adjustments/Labs and Tests Ordered: Current medicines are reviewed at length with the patient today.  Concerns regarding medicines are outlined above.  No orders of the defined types were placed in this encounter.  No orders of the defined types were placed in this encounter.    No chief complaint on file.    History of Present Illness:    Kristine Valdez is a 88 y.o. female.  Patient has past medical history of coronary artery disease, essential hypertension and dyslipidemia.  She has an element of whitecoat hypertension.  She denies any problems at this time and takes care of activities of daily living.  No chest pain orthopnea or PND.  At the time of my evaluation, the patient is alert awake oriented and in no distress.  Past  Medical History:  Diagnosis Date   Acute on chronic systolic congestive heart failure (HCC) 12/01/2011   Aortic regurgitation 09/01/2020   Arteriosclerosis of coronary artery 06/18/2021   Formatting of this note might be different from the original. Formatting of this note might be different from the original. Cardiac cath 12/02/11  Short LM, patent LAD stent with 50% ISR, 95% ostial jailed diagonal stenosis, dominant circumflex with no significant disease, nondominant RCA with calcified ostium                        2007   Anterior MI                         2007  DES to LAD for occ   Bilateral carotid bruits 11/21/2018   CAD (coronary artery disease)    Cardiac cath 12/02/11  Short LM, patent LAD stent with 50% ISR, 95% ostial jailed diagonal stenosis, dominant circumflex with no significant disease, nondominant RCA with calcified ostium                        2007   Anterior MI                         2007  DES to LAD for occluded LAD at site of prior J and J stent    Cataract, nuclear 08/22/2012  CHF (congestive heart failure) (HCC)    Chronic systolic congestive heart failure, NYHA class 2 (HCC)    Combined form of 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 Added automatically from request for surgery [id number]   Coronary artery disease    Essential hypertension 03/05/2020   Hyperlipidemia    Hypertension    Hypertensive heart disease without CHF    Impacted cerumen of left ear 11/30/2021   Macular retinal puckering, right eye 12/01/2015   Moderate mitral regurgitation 09/22/2021   Nuclear sclerosis of both eyes 04/08/2018   Old anterior myocardial infarction    Open angle with borderline findings, low risk 08/22/2012   Open angle with borderline findings, low risk, bilateral 12/01/2015   Posterior vitreous detachment, right 04/03/2014    Past Surgical History:  Procedure Laterality Date   ABDOMINAL HYSTERECTOMY     LEFT HEART  CATHETERIZATION WITH CORONARY ANGIOGRAM N/A 12/02/2011   Procedure: LEFT HEART CATHETERIZATION WITH CORONARY ANGIOGRAM;  Surgeon: Othella Boyer, MD;  Location: [facility] CATH LAB;  Service: Cardiovascular;  Laterality: N/A;    Current Medications: Current Meds  Medication Sig   acetaminophen (TYLENOL) 325 MG tablet Take 650 mg by mouth every 6 (six) hours as needed for mild pain.   amoxicillin-clavulanate (AUGMENTIN) 875-125 MG tablet Take 1 tablet by mouth 2 (two) times daily.   candesartan (ATACAND) 16 MG tablet Take 1 tablet (16 mg total) by mouth daily.   clopidogrel (PLAVIX) 75 MG tablet Take 1 tablet (75 mg total) by mouth daily.   furosemide (LASIX) 40 MG tablet TAKE 1 TABLET BY MOUTH DAILY   metoprolol tartrate (LOPRESSOR) 100 MG tablet Take 100 mg by mouth daily.   mupirocin ointment (BACTROBAN) 2 % Apply 1 application topically in the morning and at bedtime.   nitroGLYCERIN (NITROSTAT) 0.4 MG SL tablet Place 0.4 mg under the tongue every 5 (five) minutes as needed for chest pain.   VITAMIN D PO Take 1 capsule by mouth once a week. Unknown strength     Allergies:   Cortisone, Penicillins, Statins, Ace inhibitors, Codeine, Hydrocodone-acetaminophen, and Digoxin and related   Social History   Socioeconomic History   Marital status: Widowed    Spouse name: Not on file   Number of children: Not on file   Years of education: Not on file   Highest education level: Not on file  Occupational History   Not on file  Tobacco Use   Smoking status: Never   Smokeless tobacco: Never  Substance and Sexual Activity   Alcohol use: No   Drug use: No   Sexual activity: Never  Other Topics Concern   Not on file  Social History Narrative   Widow.  Lives alone   Social Determinants of Health   Financial Resource Strain: Not on file  Food Insecurity: Not on file  Transportation Needs: Not on file  Physical Activity: Not on file  Stress: Not on file  Social Connections: Not on file      Family History: The patient's family history includes Breast cancer in her sister; Cancer in her brother; Coronary artery disease in her brother; Heart attack in her father; Heart failure in her mother.  ROS:   Please see the history of present illness.    All other systems reviewed and are negative.  EKGs/Labs/Other Studies Reviewed:    The following studies were reviewed today: I discussed my findings with the patient at length  Recent Labs: 08/25/2021: TSH 1.630 08/31/2021: ALT 42; Hemoglobin 13.8; Platelets 244 09/22/2021: BUN 14; Creatinine, Ser 1.01; Potassium 4.4; Sodium 135  Recent Lipid Panel    Component Value Date/Time   CHOL 250 (H) 08/25/2021 0858   TRIG 108 08/25/2021 0858   HDL 97 08/25/2021 0858   CHOLHDL 2.6 08/25/2021 0858   CHOLHDL 2.6 12/02/2011 0940   VLDL 14 12/02/2011 0940   LDLCALC 135 (H) 08/25/2021 0858    Physical Exam:    VS:  BP (!) 182/98    Pulse (!) 112    Ht 5\' 7"  (1.702 m)    Wt 105 lb 9.6 oz (47.9 kg)    SpO2 95%    BMI 16.54 kg/m     Wt Readings from Last 3 Encounters:  12/23/21 105 lb 9.6 oz (47.9 kg)  09/22/21 104 lb 8 oz (47.4 kg)  08/25/21 105 lb 12.8 oz (48 kg)     GEN: Patient is in no acute distress HEENT: Normal NECK: No JVD; No carotid bruits LYMPHATICS: No lymphadenopathy CARDIAC: Hear sounds regular, 2/6 systolic murmur at the apex. RESPIRATORY:  Clear to auscultation without rales, wheezing or rhonchi  ABDOMEN: Soft, non-tender, non-distended MUSCULOSKELETAL:  No edema; No deformity  SKIN: Warm and dry NEUROLOGIC:  Alert and oriented x 3 PSYCHIATRIC:  Normal affect   Signed, 08/27/21, MD  12/23/2021 3:42 PM    [facility]

## 2021-12-23 NOTE — Patient Instructions (Signed)
Medication Instructions:  Your physician recommends that you continue on your current medications as directed. Please refer to the Current Medication list given to you today.  *If you need a refill on your cardiac medications before your next appointment, please call your pharmacy*   Lab Work: Your physician recommends that you have labs done in the office today. Your test included  basic metabolic panel, TSH,and hgb A1C.  If you have labs (blood work) drawn today and your tests are completely normal, you will receive your results only by: MyChart Message (if you have MyChart) OR A paper copy in the mail If you have any lab test that is abnormal or we need to change your treatment, we will call you to review the results.   Testing/Procedures: None ordered   Follow-Up: At [facility], you and your health needs are our priority.  As part of our continuing mission to provide you with exceptional heart care, we have created designated Provider Care Teams.  These Care Teams include your primary Cardiologist (physician) and Advanced Practice Providers (APPs -  Physician Assistants and Nurse Practitioners) who all work together to provide you with the care you need, when you need it.  We recommend signing up for the patient portal called "MyChart".  Sign up information is provided on this After Visit Summary.  MyChart is used to connect with patients for Virtual Visits (Telemedicine).  Patients are able to view lab/test results, encounter notes, upcoming appointments, etc.  Non-urgent messages can be sent to your provider as well.   To learn more about what you can do with MyChart, go to [email or web address].    Your next appointment:   6 month(s)  The format for your next appointment:   In Person  Provider:   Belva Crome, MD   Other Instructions NA

## 2021-12-24 LAB — BASIC METABOLIC PANEL
BUN/Creatinine Ratio: 17 (ref 12–28)
BUN: 16 mg/dL (ref 10–36)
CO2: 23 mmol/L (ref 20–29)
Calcium: 9.9 mg/dL (ref 8.7–10.3)
Chloride: 102 mmol/L (ref 96–106)
Creatinine, Ser: 0.94 mg/dL (ref 0.57–1.00)
Glucose: 85 mg/dL (ref 70–99)
Potassium: 4.3 mmol/L (ref 3.5–5.2)
Sodium: 139 mmol/L (ref 134–144)
eGFR: 57 mL/min/{1.73_m2} — ABNORMAL LOW (ref >59)

## 2021-12-24 LAB — TSH: TSH: 0.947 u[IU]/mL (ref 0.450–4.500)

## 2021-12-24 LAB — HEMOGLOBIN A1C
Est. average glucose Bld gHb Est-mCnc: 103 mg/dL
Hgb A1c MFr Bld: 5.2 % (ref 4.8–5.6)

## 2021-12-28 ENCOUNTER — Encounter (HOSPITAL_COMMUNITY): Payer: Self-pay | Admitting: Family Medicine

## 2021-12-28 ENCOUNTER — Emergency Department (HOSPITAL_COMMUNITY): Payer: Medicare Other

## 2021-12-28 ENCOUNTER — Other Ambulatory Visit: Payer: Self-pay

## 2021-12-28 ENCOUNTER — Observation Stay (HOSPITAL_COMMUNITY)
Admission: EM | Admit: 2021-12-28 | Discharge: 2021-12-29 | Disposition: A | Discharge status: Home / Self Care | Payer: Medicare Other | Attending: Emergency Medicine | Admitting: Emergency Medicine

## 2021-12-28 DIAGNOSIS — R0602 Shortness of breath: Secondary | ICD-10-CM | POA: Diagnosis present

## 2021-12-28 DIAGNOSIS — E782 Mixed hyperlipidemia: Secondary | ICD-10-CM | POA: Diagnosis present

## 2021-12-28 DIAGNOSIS — I161 Hypertensive emergency: Secondary | ICD-10-CM | POA: Insufficient documentation

## 2021-12-28 DIAGNOSIS — I5043 Acute on chronic combined systolic (congestive) and diastolic (congestive) heart failure: Secondary | ICD-10-CM | POA: Diagnosis not present

## 2021-12-28 DIAGNOSIS — Z20822 Contact with and (suspected) exposure to covid-19: Secondary | ICD-10-CM | POA: Insufficient documentation

## 2021-12-28 DIAGNOSIS — E871 Hypo-osmolality and hyponatremia: Secondary | ICD-10-CM | POA: Diagnosis present

## 2021-12-28 DIAGNOSIS — Z955 Presence of coronary angioplasty implant and graft: Secondary | ICD-10-CM | POA: Diagnosis not present

## 2021-12-28 DIAGNOSIS — I351 Nonrheumatic aortic (valve) insufficiency: Secondary | ICD-10-CM

## 2021-12-28 DIAGNOSIS — Z7901 Long term (current) use of anticoagulants: Secondary | ICD-10-CM | POA: Insufficient documentation

## 2021-12-28 DIAGNOSIS — Z79899 Other long term (current) drug therapy: Secondary | ICD-10-CM | POA: Insufficient documentation

## 2021-12-28 DIAGNOSIS — N289 Disorder of kidney and ureter, unspecified: Secondary | ICD-10-CM | POA: Diagnosis not present

## 2021-12-28 DIAGNOSIS — I130 Hypertensive heart and chronic kidney disease with heart failure and stage 1 through stage 4 chronic kidney disease, or unspecified chronic kidney disease: Secondary | ICD-10-CM | POA: Diagnosis not present

## 2021-12-28 DIAGNOSIS — I2510 Atherosclerotic heart disease of native coronary artery without angina pectoris: Secondary | ICD-10-CM | POA: Diagnosis not present

## 2021-12-28 DIAGNOSIS — J810 Acute pulmonary edema: Secondary | ICD-10-CM | POA: Diagnosis not present

## 2021-12-28 DIAGNOSIS — I10 Essential (primary) hypertension: Secondary | ICD-10-CM | POA: Diagnosis present

## 2021-12-28 DIAGNOSIS — N183 Chronic kidney disease, stage 3 unspecified: Secondary | ICD-10-CM | POA: Diagnosis not present

## 2021-12-28 DIAGNOSIS — J9601 Acute respiratory failure with hypoxia: Secondary | ICD-10-CM | POA: Diagnosis not present

## 2021-12-28 DIAGNOSIS — I5023 Acute on chronic systolic (congestive) heart failure: Secondary | ICD-10-CM | POA: Diagnosis not present

## 2021-12-28 DIAGNOSIS — R7401 Elevation of levels of liver transaminase levels: Secondary | ICD-10-CM

## 2021-12-28 DIAGNOSIS — I509 Heart failure, unspecified: Secondary | ICD-10-CM

## 2021-12-28 LAB — CBC WITH DIFFERENTIAL/PLATELET
Abs Immature Granulocytes: 0.05 10*3/uL (ref 0.00–0.07)
Basophils Absolute: 0.1 10*3/uL (ref 0.0–0.1)
Basophils Relative: 1 %
Eosinophils Absolute: 0.1 10*3/uL (ref 0.0–0.5)
Eosinophils Relative: 1 %
HCT: 39.2 % (ref 36.0–46.0)
Hemoglobin: 12.7 g/dL (ref 12.0–15.0)
Immature Granulocytes: 1 %
Lymphocytes Relative: 32 %
Lymphs Abs: 2.7 10*3/uL (ref 0.7–4.0)
MCH: 32.9 pg (ref 26.0–34.0)
MCHC: 32.4 g/dL (ref 30.0–36.0)
MCV: 101.6 fL — ABNORMAL HIGH (ref 80.0–100.0)
Monocytes Absolute: 0.7 10*3/uL (ref 0.1–1.0)
Monocytes Relative: 8 %
Neutro Abs: 4.8 10*3/uL (ref 1.7–7.7)
Neutrophils Relative %: 57 %
Platelets: 342 10*3/uL (ref 150–400)
RBC: 3.86 MIL/uL — ABNORMAL LOW (ref 3.87–5.11)
RDW: 14.1 % (ref 11.5–15.5)
WBC: 8.4 10*3/uL (ref 4.0–10.5)
nRBC: 0 % (ref 0.0–0.2)

## 2021-12-28 LAB — COMPREHENSIVE METABOLIC PANEL
ALT: 86 U/L — ABNORMAL HIGH (ref 0–44)
AST: 113 U/L — ABNORMAL HIGH (ref 15–41)
Albumin: 3.2 g/dL — ABNORMAL LOW (ref 3.5–5.0)
Alkaline Phosphatase: 111 U/L (ref 38–126)
Anion gap: 10 (ref 5–15)
BUN: 19 mg/dL (ref 8–23)
CO2: 23 mmol/L (ref 22–32)
Calcium: 9.4 mg/dL (ref 8.9–10.3)
Chloride: 101 mmol/L (ref 98–111)
Creatinine, Ser: 1.18 mg/dL — ABNORMAL HIGH (ref 0.44–1.00)
GFR, Estimated: 43 mL/min — ABNORMAL LOW (ref >60)
Glucose, Bld: 202 mg/dL — ABNORMAL HIGH (ref 70–99)
Potassium: 4.3 mmol/L (ref 3.5–5.1)
Sodium: 134 mmol/L — ABNORMAL LOW (ref 135–145)
Total Bilirubin: 0.9 mg/dL (ref 0.3–1.2)
Total Protein: 7.4 g/dL (ref 6.5–8.1)

## 2021-12-28 LAB — RESP PANEL BY RT-PCR (FLU A&B, COVID) ARPGX2
Influenza A by PCR: NEGATIVE
Influenza B by PCR: NEGATIVE
SARS Coronavirus 2 by RT PCR: NEGATIVE

## 2021-12-28 LAB — BRAIN NATRIURETIC PEPTIDE: B Natriuretic Peptide: 1356 pg/mL — ABNORMAL HIGH (ref 0.0–100.0)

## 2021-12-28 LAB — TROPONIN I (HIGH SENSITIVITY): Troponin I (High Sensitivity): 12 ng/L (ref <18)

## 2021-12-28 LAB — TSH: TSH: 1.443 u[IU]/mL (ref 0.350–4.500)

## 2021-12-28 MED ORDER — IRBESARTAN 300 MG PO TABS
300.0000 mg | ORAL_TABLET | Freq: Every day | ORAL | Status: DC
  Administered 2021-12-28 – 2021-12-29 (×2): 300 mg via ORAL
  Filled 2021-12-28 (×2): qty 1

## 2021-12-28 MED ORDER — ACETAMINOPHEN 325 MG PO TABS: 650.0000 mg | ORAL_TABLET | ORAL | Status: DC | PRN

## 2021-12-28 MED ORDER — ALBUTEROL SULFATE HFA 108 (90 BASE) MCG/ACT IN AERS: 2.0000 | INHALATION_SPRAY | RESPIRATORY_TRACT | Status: DC | PRN

## 2021-12-28 MED ORDER — NITROGLYCERIN IN D5W 200-5 MCG/ML-% IV SOLN
0.0000 ug/min | INTRAVENOUS | Status: DC
  Administered 2021-12-28: 5 ug/min via INTRAVENOUS
  Filled 2021-12-28: qty 250

## 2021-12-28 MED ORDER — FUROSEMIDE 10 MG/ML IJ SOLN
40.0000 mg | Freq: Two times a day (BID) | INTRAMUSCULAR | Status: DC
  Administered 2021-12-28: 40 mg via INTRAVENOUS
  Filled 2021-12-28: qty 4

## 2021-12-28 MED ORDER — SODIUM CHLORIDE 0.9 % IV SOLN: 250.0000 mL | INTRAVENOUS | Status: DC | PRN

## 2021-12-28 MED ORDER — SODIUM CHLORIDE 0.9% FLUSH: 3.0000 mL | INTRAVENOUS | Status: DC | PRN

## 2021-12-28 MED ORDER — METOPROLOL SUCCINATE ER 100 MG PO TB24
100.0000 mg | ORAL_TABLET | Freq: Every day | ORAL | Status: DC
  Filled 2021-12-28: qty 1

## 2021-12-28 MED ORDER — ONDANSETRON HCL 4 MG/2ML IJ SOLN: 4.0000 mg | Freq: Four times a day (QID) | INTRAMUSCULAR | Status: DC | PRN

## 2021-12-28 MED ORDER — SODIUM CHLORIDE 0.9% FLUSH
3.0000 mL | Freq: Two times a day (BID) | INTRAVENOUS | Status: DC
  Administered 2021-12-28 – 2021-12-29 (×3): 3 mL via INTRAVENOUS

## 2021-12-28 MED ORDER — ENOXAPARIN SODIUM 40 MG/0.4ML IJ SOSY
40.0000 mg | PREFILLED_SYRINGE | INTRAMUSCULAR | Status: DC
  Administered 2021-12-28 – 2021-12-29 (×2): 40 mg via SUBCUTANEOUS
  Filled 2021-12-28 (×2): qty 0.4

## 2021-12-28 MED ORDER — CLOPIDOGREL BISULFATE 75 MG PO TABS
75.0000 mg | ORAL_TABLET | Freq: Every day | ORAL | Status: DC
  Administered 2021-12-28 – 2021-12-29 (×2): 75 mg via ORAL
  Filled 2021-12-28 (×2): qty 1

## 2021-12-28 MED ORDER — METOPROLOL TARTRATE 25 MG PO TABS
100.0000 mg | ORAL_TABLET | Freq: Every day | ORAL | Status: DC
  Administered 2021-12-28: 100 mg via ORAL
  Filled 2021-12-28: qty 4

## 2021-12-28 MED ORDER — FUROSEMIDE 10 MG/ML IJ SOLN
80.0000 mg | Freq: Once | INTRAMUSCULAR | Status: AC
  Administered 2021-12-28: 80 mg via INTRAVENOUS
  Filled 2021-12-28: qty 8

## 2021-12-28 MED ORDER — FUROSEMIDE 10 MG/ML IJ SOLN
40.0000 mg | Freq: Every day | INTRAMUSCULAR | Status: DC
  Administered 2021-12-29: 40 mg via INTRAVENOUS
  Filled 2021-12-28: qty 4

## 2021-12-28 NOTE — H&P (Addendum)
History and Physical    Patient: Kristine Valdez [id number] DOB: 1926-06-23 DOA: 12/28/2021 DOS: the patient was seen and examined on 12/28/2021 PCP: Leonard Downing, MD  Patient coming from: Home via EMS  Chief Complaint:  Chief Complaint  Patient presents with   Shortness of Breath    HPI: Kristine Valdez is a 88 y.o. female with medical history significant of hypertension, mixed dyslipidemia, systolic CHF last EF [id number] with grade 3 diastolic dysfunction, and CAD s/p PCI who presents with complaints of shortness of breath.  At baseline patient is ambulatory without need of assistance and does not require oxygen.  She had been in her normal state of health but woke up this morning and reports that she was unable to catch her breath.  Notes that her chest felt tight.  Patient denied any complaints of chest pain, cough, fever, chills, leg swelling, calf pain, nausea, vomiting, constipation, or diarrhea.  She was given a nitroglycerin by her son.  Last week she had issues with her sinuses for which she was treated with 5-day course of amoxicillin with improvement in those symptoms.  Her daughter notes that her eyes have been puffy since her recent eye surgery.  At baseline patient does not drink a lot of fluids.  She has been compliant with furosemide, but admits missing a dose once or twice a week due to either going to church or out. The patient had just been seen in her cardiologist office 5 days prior and states no changes were made at that time.   In route with EMS patient was noted to have O2 saturations as low as 88% on room air with expiratory wheezes and was given nitroglycerin.  Upon admission to the emergency department patient was noted to be afebrile with respirations 17-29, blood pressures elevated up to 175/106, and O2 saturations as low as 83% initially placed on BiPAP with O2 saturations maintained.  Labs significant for creatinine 2.18, glucose 202, AST 113, ALT 86, BNP  1356.  Chest x-ray noted extensive pulmonary infiltrate suggestive of moderate to severe pulmonary edema. Patient was able to be weaned to 3 L of nasal cannula oxygen.  She has been given Lasix 80 mg IV and started on a nitroglycerin drip.  Review of Systems: As mentioned in the history of present illness. All other systems reviewed and are negative. Past Medical History:  Diagnosis Date   Acute on chronic systolic congestive heart failure ([place]) 12/01/2011   Aortic regurgitation 09/01/2020   Arteriosclerosis of coronary artery 06/18/2021   Formatting of this note might be different from the original. Formatting of this note might be different from the original. Cardiac cath 12/02/11  Short LM, patent LAD stent with 50% ISR, 95% ostial jailed diagonal stenosis, dominant circumflex with no significant disease, nondominant RCA with calcified ostium                        2007   Anterior MI                         2007  DES to LAD for occ   Bilateral carotid bruits 11/21/2018   CAD (coronary artery disease)    Cardiac cath 12/02/11  Short LM, patent LAD stent with 50% ISR, 95% ostial jailed diagonal stenosis, dominant circumflex with no significant disease, nondominant RCA with calcified ostium  2007   Anterior MI                         2007  DES to LAD for occluded LAD at site of prior J and J stent    Cataract, nuclear 08/22/2012   CHF (congestive heart failure) (HCC)    Chronic systolic congestive heart failure, NYHA class 2 ([place])    Combined form of 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 Added automatically from request for surgery [id number]   Coronary artery disease    Essential hypertension 03/05/2020   Hyperlipidemia    Hypertension    Hypertensive heart disease without CHF    Impacted cerumen of left ear 11/30/2021   Macular retinal puckering, right eye 12/01/2015   Moderate mitral regurgitation 09/22/2021    Nuclear sclerosis of both eyes 04/08/2018   Old anterior myocardial infarction    Open angle with borderline findings, low risk 08/22/2012   Open angle with borderline findings, low risk, bilateral 12/01/2015   Posterior vitreous detachment, right 04/03/2014   Past Surgical History:  Procedure Laterality Date   ABDOMINAL HYSTERECTOMY     LEFT HEART CATHETERIZATION WITH CORONARY ANGIOGRAM N/A 12/02/2011   Procedure: LEFT HEART CATHETERIZATION WITH CORONARY ANGIOGRAM;  Surgeon: Jacolyn Reedy, MD;  Location: [facility] CATH LAB;  Service: Cardiovascular;  Laterality: N/A;   Social History:  reports that she has never smoked. She has never used smokeless tobacco. She reports that she does not drink alcohol and does not use drugs.  Allergies  Allergen Reactions   Cortisone Other (See Comments)    Blurred vision   Penicillins Other (See Comments)    Has patient had a PCN reaction causing immediate rash, facial/tongue/throat swelling, SOB or lightheadedness with hypotension: UNK Has patient had a PCN reaction causing severe rash involving mucus membranes or skin necrosis: UNK Has patient had a PCN reaction that required hospitalization: UNK Has patient had a PCN reaction occurring within the last 10 years: No If all of the above answers are "NO", then may proceed with Cephalosporin use.    Statins Other (See Comments)    Elevates blood pressure   Ace Inhibitors Cough   Codeine Nausea Only and Other (See Comments)   Hydrocodone-Acetaminophen Nausea Only and Other (See Comments)   Digoxin And Related     dizzy    Family History  Problem Relation Age of Onset   Heart attack Father    Coronary artery disease Brother        History of CABG   Cancer Brother    Heart failure Mother    Breast cancer Sister     Prior to Admission medications   Medication Sig Start Date End Date Taking? Authorizing Provider  acetaminophen (TYLENOL) 325 MG tablet Take 650 mg by mouth every 6 (six) hours as needed for  mild pain.    [provider]  amoxicillin-clavulanate (AUGMENTIN) 875-125 MG tablet Take 1 tablet by mouth 2 (two) times daily. 12/21/21   [provider]  candesartan (ATACAND) 16 MG tablet Take 1 tablet (16 mg total) by mouth daily. 08/25/21   Revankar, Reita Cliche, MD  clopidogrel (PLAVIX) 75 MG tablet Take 1 tablet (75 mg total) by mouth daily. 02/11/19   Revankar, Reita Cliche, MD  furosemide (LASIX) 40 MG tablet TAKE 1 TABLET BY MOUTH DAILY 01/18/21   Revankar, Reita Cliche, MD  metoprolol tartrate (LOPRESSOR) 100 MG  tablet Take 100 mg by mouth daily.    [provider]  mupirocin ointment (BACTROBAN) 2 % Apply 1 application topically in the morning and at bedtime. 12/20/21   [provider]  nitroGLYCERIN (NITROSTAT) 0.4 MG SL tablet Place 0.4 mg under the tongue every 5 (five) minutes as needed for chest pain.    [provider]  VITAMIN D PO Take 1 capsule by mouth once a week. Unknown strength    [provider]    Physical Exam: Vitals:   12/28/21 0715 12/28/21 0730 12/28/21 0815 12/28/21 0817  BP: 140/80 (!) 155/84 (!) 164/91   Pulse: 85 87 89 90  Resp: 17 20 (!) 24 19  Temp:      TempSrc:      SpO2: 100% 100% 92% 94%  Constitutional: Frail elderly female who appears to be in no acute distress at this time Eyes: PERRL, lids and conjunctivae normal ENMT: Mucous membranes are moist. Posterior pharynx clear of any exudate or lesions.   Neck: normal, supple.  JVD present Respiratory: Normal respiratory effort with scattered crackles appreciated.  Patient currently on 2 L nasal cannula oxygen with O2 saturations maintained. Cardiovascular: Tachycardia.  No extremity edema. 2+ pedal pulses.  Abdomen: no tenderness, no masses palpated. Bowel sounds positive.  Musculoskeletal: no clubbing / cyanosis. No joint deformity upper and lower extremities. Good ROM, no contractures. Normal muscle tone.  Skin: no rashes, lesions, ulcers. No  induration Neurologic: CN 2-12 grossly intact. Sensation intact, DTR normal. Strength 5/5 in all 4.  Psychiatric: Normal judgment and insight. Alert and oriented x 3. Normal mood.    Data Reviewed:   Sinus rhythm at 83 bpm  Assessment and Plan: Acute respiratory failure with hypoxia secondary to acute on chronic combined systolic and diastolic CHF (congestive heart failure) ([place])- (present on admission) Patient presents with complaints of shortness of breath and was noted to be hypoxic down to 88% on room air.  Chest x-ray concerning for moderate to severe pulmonary edema.  BNP elevated at 1356.  Patient's last echocardiogram in 08/2021 noted EF  30-35% with grade 3 diastolic dysfunction. This was noted to be a decrease from prior echocardiogram in 2020.  Patient had initially been given 80 mg of Lasix IV in the ED. She admitted to missing a dose of her furosemide possibly 1-2 times per week. -Admit to a progressive bed -Heart failure orders set  initiated  -Continuous pulse oximetry with nasal cannula oxygen as needed to keep O2 saturations >92% -Strict I&Os and daily weights -Elevate lower extremities -Add on TSH -Lasix 40 mg IV daily -Reassess in a.m. and adjust diuresis as needed. -Check echocardiogram -Optimize medical management when able -May warrant consultation to cardiology in a.m.    Hyponatremia- (present on admission) Acute.  Sodium was mildly low at 134.  Thought to be a hypovolemic hyponatremia in the setting of CHF exacerbation. -Continue to monitor  Transaminitis Acute.  Labs were significant for AST 13 and ALT 86 with total bilirubin within normal limit.  AST and ALT previously have been within normal limits.  Suspect symptoms secondary to passive hepatic congestion in the setting of CHF. -Continue to monitor and consider need of further work-up if warranted  Renal insufficiency superimposed on chronic kidney disease stage IIIa Patient noted to have creatinine of  1.18 with BUN 19 on admission.  Baseline creatinine had been 0.94 with GFR noted to be 57 on 2/23.  This is not greater than 0.3 increased from baseline  to suggest acute kidney injury. -Monitor kidney function with diuresis  Essential hypertension- (present on admission) Initial blood pressures elevated up to 175/106.  Home medication regimen includes furosemide 40 mg daily, candesartan 16 mg daily, and metoprolol tartrate 100 mg daily.   -Wean nitroglycerin drip off -Continue metoprolol and pharmacy substitution for candesartan   CAD (coronary artery disease)- (present on admission) Patient had anterior MI in 2007 with drug-eluting stent to LAD.  She had previously not been able to tolerate statins - Continue Plavix        Advance Care Planning:   Code Status: Full Code   Consults: Cardiology  Family Communication: Daughter updated at bedside  Severity of Illness: The appropriate patient status for this patient is INPATIENT. Inpatient status is judged to be reasonable and necessary in order to provide the required intensity of service to ensure the patient's safety. The patient's presenting symptoms, physical exam findings, and initial radiographic and laboratory data in the context of their chronic comorbidities is felt to place them at high risk for further clinical deterioration. Furthermore, it is not anticipated that the patient will be medically stable for discharge from the hospital within 2 midnights of admission.   * I certify that at the point of admission it is my clinical judgment that the patient will require inpatient hospital care spanning beyond 2 midnights from the point of admission due to high intensity of service, high risk for further deterioration and high frequency of surveillance required.*  Author: Norval Morton, MD 12/28/2021 8:40 AM  For on call review [email or web address].

## 2021-12-28 NOTE — Progress Notes (Signed)
Heart Failure Nurse Navigator Progress Note  PCP: Leonard Downing, MD PCP-Cardiologist: Reuel Derby., MD Admission Diagnosis: acute pulm edema Admitted from: home with son  Presentation:   Kristine Valdez presented 2/28 increased SOB. Pt on ED stretcher with daughter at bedside. Pt pleasant. Patient interactive with interview process. Pt lives with son. Pt drives locally, family will bring her to follow up appointments. Pt states she will skip water pill to attend church or when going out for the day. Explained importance of medication compliance. Endorses sodium indiscretion.   ECHO/ LVEF: 30-35%  Clinical Course:  Past Medical History:  Diagnosis Date   Acute on chronic systolic congestive heart failure ([place]) 12/01/2011   Aortic regurgitation 09/01/2020   Arteriosclerosis of coronary artery 06/18/2021   Formatting of this note might be different from the original. Formatting of this note might be different from the original. Cardiac cath 12/02/11  Short LM, patent LAD stent with 50% ISR, 95% ostial jailed diagonal stenosis, dominant circumflex with no significant disease, nondominant RCA with calcified ostium                        2007   Anterior MI                         2007  DES to LAD for occ   Bilateral carotid bruits 11/21/2018   CAD (coronary artery disease)    Cardiac cath 12/02/11  Short LM, patent LAD stent with 50% ISR, 95% ostial jailed diagonal stenosis, dominant circumflex with no significant disease, nondominant RCA with calcified ostium                        2007   Anterior MI                         2007  DES to LAD for occluded LAD at site of prior J and J stent    Cataract, nuclear 08/22/2012   CHF (congestive heart failure) ([place])    Chronic systolic congestive heart failure, NYHA class 2 ([place])    Combined form of 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 Added automatically from request for surgery  [id number]   Coronary artery disease    Essential hypertension 03/05/2020   Hyperlipidemia    Hypertension    Hypertensive heart disease without CHF    Impacted cerumen of left ear 11/30/2021   Macular retinal puckering, right eye 12/01/2015   Moderate mitral regurgitation 09/22/2021   Nuclear sclerosis of both eyes 04/08/2018   Old anterior myocardial infarction    Open angle with borderline findings, low risk 08/22/2012   Open angle with borderline findings, low risk, bilateral 12/01/2015   Posterior vitreous detachment, right 04/03/2014     Social History   Socioeconomic History   Marital status: Widowed    Spouse name: Not on file   Number of children: 39   Years of education: Not on file   Highest education level: Not on file  Occupational History   Occupation: retired  Tobacco Use   Smoking status: Never   Smokeless tobacco: Never  Vaping Use   Vaping Use: Never used  Substance and Sexual Activity   Alcohol use: No   Drug use: No   Sexual activity: Never  Other Topics Concern   Not on  file  Social History Narrative   Widow.  Lives alone   Social Determinants of Health   Financial Resource Strain: Low Risk    Difficulty of Paying Living Expenses: Not hard at all  Food Insecurity: No Food Insecurity   Worried About Charity fundraiser in the Last Year: Never true   Arboriculturist in the Last Year: Never true  Transportation Needs: No Transportation Needs   Lack of Transportation (Medical): No   Lack of Transportation (Non-Medical): No  Physical Activity: Not on file  Stress: Not on file  Social Connections: Not on file    High Risk Criteria for Readmission and/or Poor Patient Outcomes: Heart failure hospital admissions (last 6 months): 1  No Show rate: 0% Difficult social situation: no Demonstrates medication adherence: no Primary Language: English Literacy level: able to read/write and comprehend.  Barriers of Care:   -medication compliance -sodium  indiscretion  Considerations/Referrals:   Referral made to Heart Failure Pharmacist Stewardship: yes, appreciated Referral made to Heart Failure CSW/NCM TOC: no Referral made to Heart & Vascular TOC clinic: yes, 3/14  Items for Follow-up on DC/TOC: -optimize -med compliance -sodium modifications   Pricilla Holm, MSN, RN Heart Failure Nurse Navigator [phone]

## 2021-12-28 NOTE — ED Notes (Signed)
RN and Resp Therapist at bedside to trial pt off Bipap. Bipap removed, pt placed on 3L [place].

## 2021-12-28 NOTE — ED Provider Notes (Signed)
[facility] EMERGENCY DEPARTMENT Provider Note   CSN: [id number] Arrival date & time: 12/28/21  1751     History  Chief Complaint  Patient presents with   Shortness of Breath    Kristine Valdez is a 88 y.o. female.  Patient presents to the emergency department from home.  Patient comes by ambulance.  Patient woke up feeling suddenly short of breath.  She does report that she has been sick for the last 5 days with sinus congestion.  She was started on amoxicillin by her doctor for this.  EMS report that she had some expiratory wheezing upon their arrival.  Room air oxygen saturations were 88%.  She was initially very hypertensive.  Patient arrives on CPAP to maintain her oxygen saturations.  Patient denies any chest pain.       Home Medications Prior to Admission medications   Medication Sig Start Date End Date Taking? Authorizing Provider  acetaminophen (TYLENOL) 325 MG tablet Take 650 mg by mouth every 6 (six) hours as needed for mild pain.    [provider]  amoxicillin-clavulanate (AUGMENTIN) 875-125 MG tablet Take 1 tablet by mouth 2 (two) times daily. 12/21/21   [provider]  candesartan (ATACAND) 16 MG tablet Take 1 tablet (16 mg total) by mouth daily. 08/25/21   Revankar, Aundra Dubin, MD  clopidogrel (PLAVIX) 75 MG tablet Take 1 tablet (75 mg total) by mouth daily. 02/11/19   Revankar, Aundra Dubin, MD  furosemide (LASIX) 40 MG tablet TAKE 1 TABLET BY MOUTH DAILY 01/18/21   Revankar, Aundra Dubin, MD  metoprolol tartrate (LOPRESSOR) 100 MG tablet Take 100 mg by mouth daily.    [provider]  mupirocin ointment (BACTROBAN) 2 % Apply 1 application topically in the morning and at bedtime. 12/20/21   [provider]  nitroGLYCERIN (NITROSTAT) 0.4 MG SL tablet Place 0.4 mg under the tongue every 5 (five) minutes as needed for chest pain.    [provider]  VITAMIN D PO Take 1 capsule by mouth once a week. Unknown strength     [provider]      Allergies    Cortisone, Penicillins, Statins, Ace inhibitors, Codeine, Hydrocodone-acetaminophen, and Digoxin and related    Review of Systems   Review of Systems  HENT:  Positive for congestion.   Respiratory:  Positive for shortness of breath.    Physical Exam Updated Vital Signs BP 140/80    Pulse 85    Temp (!) 97.4 F (36.3 C) (Oral)    Resp 17    SpO2 100%  Physical Exam Vitals and nursing note reviewed.  Constitutional:      General: She is in acute distress.     Appearance: She is well-developed.  HENT:     Head: Normocephalic and atraumatic.     Mouth/Throat:     Mouth: Mucous membranes are moist.  Eyes:     General: Vision grossly intact. Gaze aligned appropriately.     Extraocular Movements: Extraocular movements intact.     Conjunctiva/sclera: Conjunctivae normal.  Cardiovascular:     Rate and Rhythm: Normal rate and regular rhythm.     Pulses: Normal pulses.     Heart sounds: Normal heart sounds, S1 normal and S2 normal. No murmur heard.   No friction rub. No gallop.  Pulmonary:     Effort: Tachypnea, accessory muscle usage and respiratory distress present.     Breath sounds: Rales present.  Abdominal:     General:  Bowel sounds are normal.     Palpations: Abdomen is soft.     Tenderness: There is no abdominal tenderness. There is no guarding or rebound.     Hernia: No hernia is present.  Musculoskeletal:        General: No swelling.     Cervical back: Full passive range of motion without pain, normal range of motion and neck supple. No spinous process tenderness or muscular tenderness. Normal range of motion.     Right lower leg: No edema.     Left lower leg: No edema.  Skin:    General: Skin is warm and dry.     Capillary Refill: Capillary refill takes less than 2 seconds.     Findings: No ecchymosis, erythema, rash or wound.  Neurological:     General: No focal deficit present.     Mental Status: She is alert and  oriented to person, place, and time.     GCS: GCS eye subscore is 4. GCS verbal subscore is 5. GCS motor subscore is 6.     Cranial Nerves: Cranial nerves 2-12 are intact.     Sensory: Sensation is intact.     Motor: Motor function is intact.     Coordination: Coordination is intact.  Psychiatric:        Attention and Perception: Attention normal.        Mood and Affect: Mood normal.        Speech: Speech normal.        Behavior: Behavior normal.    ED Results / Procedures / Treatments   Labs (all labs ordered are listed, but only abnormal results are displayed) Labs Reviewed  CBC WITH DIFFERENTIAL/PLATELET - Abnormal; Notable for the following components:      Result Value   RBC 3.86 (*)    MCV 101.6 (*)    All other components within normal limits  COMPREHENSIVE METABOLIC PANEL - Abnormal; Notable for the following components:   Sodium 134 (*)    Glucose, Bld 202 (*)    Creatinine, Ser 1.18 (*)    Albumin 3.2 (*)    AST 113 (*)    ALT 86 (*)    GFR, Estimated 43 (*)    All other components within normal limits  BRAIN NATRIURETIC PEPTIDE - Abnormal; Notable for the following components:   B Natriuretic Peptide 1,356.0 (*)    All other components within normal limits  RESP PANEL BY RT-PCR (FLU A&B, COVID) ARPGX2  TROPONIN I (HIGH SENSITIVITY)  TROPONIN I (HIGH SENSITIVITY)    EKG None  Radiology DG Chest Portable 1 View  Result Date: 12/28/2021 CLINICAL DATA:  Dyspnea EXAM: PORTABLE CHEST 1 VIEW COMPARISON:  08/31/2021 FINDINGS: Pulmonary insufflation is normal and symmetric. Superimposed extensive, diffuse interstitial and airspace infiltrate appears progressive since prior examination in keeping with progressive, moderate to severe pulmonary edema, less likely infection. No pneumothorax or pleural effusion. Cardiac size is mildly enlarged. Pulmonary vascular redistribution of the lung apices again noted. No acute bone abnormality. IMPRESSION: Extensive pulmonary  infiltrate, progressive since prior examination, most suggestive of progressive, moderate to severe pulmonary edema. Electronically Signed   By: Helyn Numbers M.D.   On: 12/28/2021 04:12    Procedures .Critical Care Performed by: Gilda Crease, MD Authorized by: Gilda Crease, MD   Critical care provider statement:    Critical care time (minutes):  35   Critical care was necessary to treat or prevent imminent or life-threatening deterioration of the following  conditions:  Respiratory failure   Critical care was time spent personally by me on the following activities:  Development of treatment plan with patient or surrogate, discussions with consultants, evaluation of patient's response to treatment, examination of patient, ordering and review of laboratory studies, ordering and review of radiographic studies, ordering and performing treatments and interventions, pulse oximetry, re-evaluation of patient's condition and review of old charts    Medications Ordered in ED Medications  albuterol (VENTOLIN HFA) 108 (90 Base) MCG/ACT inhaler 2 puff (has no administration in time range)  nitroGLYCERIN 50 mg in dextrose 5 % 250 mL (0.2 mg/mL) infusion (10 mcg/min Intravenous Rate/Dose Change 12/28/21 0648)  furosemide (LASIX) injection 80 mg (80 mg Intravenous Given 12/28/21 0416)    ED Course/ Medical Decision Making/ A&P                           Medical Decision Making Amount and/or Complexity of Data Reviewed Independent Historian: caregiver    Details: daughter External Data Reviewed: ECG and notes.    Details: prior Echo Labs: ordered. Decision-making details documented in ED Course. Radiology: ordered and independent interpretation performed. Decision-making details documented in ED Course. ECG/medicine tests: ordered and independent interpretation performed. Decision-making details documented in ED Course.  Risk Prescription drug management. Decision regarding  hospitalization.   Patient presents to the ER for evaluation of difficulty breathing.  Differential diagnosis pulmonary edema, acute coronary syndrome, pneumonia  Chest x-ray performed at arrival, independently viewed by myself is consistent with pulmonary edema.  Patient continued on BiPAP and is significantly improved, much more comfortable.  Patient normal sinus rhythm on the monitor.  Patient very hypertensive prehospital and continues to be hypertensive here in the department.  Presentation consistent with hypertensive urgency with congestive heart failure.  Review of her records indicates that she has an ejection fraction around 30%.  Patient initiated on nitro drip to help with blood pressure and preload, administered IV Lasix.  Will admit to hospital for further management.        Final Clinical Impression(s) / ED Diagnoses Final diagnoses:  Hypertensive emergency  Acute pulmonary edema [facility])    Rx / DC Orders ED Discharge Orders     None         Anjolaoluwa Siguenza, Canary Brim, MD 12/28/21 [id number]

## 2021-12-28 NOTE — Assessment & Plan Note (Addendum)
Patient had anterior MI in 2007 with drug-eluting stent to LAD.  She had previously not been able to tolerate statins - Continue Plavix

## 2021-12-28 NOTE — Progress Notes (Signed)
Pt resting comfortably in bed. VS stable. Pt stated that she does not wear bipap at home. No Resp. Distress noted. No BiPAP needed at this time.

## 2021-12-28 NOTE — Assessment & Plan Note (Addendum)
Initial blood pressures elevated up to 175/106.  Home medication regimen includes furosemide 40 mg daily, candesartan 16 mg daily, and metoprolol tartrate 100 mg daily.   -Wean nitroglycerin drip off -Continue metoprolol and pharmacy substitution for candesartan

## 2021-12-28 NOTE — Assessment & Plan Note (Signed)
Patient noted to have creatinine of 1.18 with BUN 19 on admission.  Baseline creatinine had been 0.94 with GFR noted to be 57 on 2/23.  This is not greater than 0.3 increased from baseline to suggest acute kidney injury. -Monitor kidney function with diuresis

## 2021-12-28 NOTE — Assessment & Plan Note (Addendum)
Patient presents with complaints of shortness of breath and was noted to be hypoxic down to 88% on room air.  Chest x-ray concerning for moderate to severe pulmonary edema.  BNP elevated at 1356.  Patient's last echocardiogram in 08/2021 noted EF  30-35% with grade 3 diastolic dysfunction. This was noted to be a decrease from prior echocardiogram in 2020.  Patient had initially been given 80 mg of Lasix IV in the ED. She admitted to missing a dose of her furosemide possibly 1-2 times per week. -Admit to a progressive bed -Heart failure orders set  initiated  -Continuous pulse oximetry with nasal cannula oxygen as needed to keep O2 saturations >92% -Strict I&Os and daily weights -Elevate lower extremities -Add on TSH -Lasix 40 mg IV daily -Reassess in a.m. and adjust diuresis as needed. -Check echocardiogram -Optimize medical management when able -May warrant consultation to cardiology in a.m.

## 2021-12-28 NOTE — Assessment & Plan Note (Signed)
Acute.  Labs were significant for AST 13 and ALT 86 with total bilirubin within normal limit.  AST and ALT previously have been within normal limits.  Suspect symptoms secondary to passive hepatic congestion in the setting of CHF. -Continue to monitor and consider need of further work-up if warranted

## 2021-12-28 NOTE — Assessment & Plan Note (Signed)
Acute.  Sodium was mildly low at 134.  Thought to be a hypovolemic hyponatremia in the setting of CHF exacerbation. -Continue to monitor

## 2021-12-28 NOTE — ED Triage Notes (Signed)
Pt from home via [facility], sudden onset SHOB. Recent dx sinus infection, on amoxicillin. 88% RA, expiratory wheezes, 2 NTG approx 0200. Hx stents, CHF, CAD, HTN.  EKG unremarkable 172/106 HR 80's

## 2021-12-28 NOTE — Consult Note (Addendum)
Cardiology Consultation:   Patient ID: KABREA SEESE MRN: [id number]; DOB: Aug 16, 1926  Admit date: 12/28/2021 Date of Consult: 12/28/2021  PCP:  Leonard Downing, MD   [facility] Providers Cardiologist:  Jenean Lindau, MD        Patient Profile:   LACE CRUEY is a 88 y.o. female with a hx of CAD, hypertension, hyperlipidemia, chronic systolic heart failure, and moderate MR who is being seen 12/28/2021 for the evaluation of acute systolic CHF and elevated BP at the request of Dr. Tamala Julian.  History of Present Illness:   Ms. Tokash is a pleasant 88 year old female with past medical history of CAD, hypertension, hyperlipidemia, chronic systolic heart failure, and moderate MR.  Patient had anterior MI in 2007 with DES to LAD.  Previous cardiac catheterization performed on 12/02/2011 showed short left main, patent LAD stent with 50% in-stent restenosis, 95% ostial jailed diagonal lesion, dominant left circumflex but no significant disease, nondominant RCA with calcified ostium.  Echocardiogram obtained on 12/04/2018 showed EF 60 to 65%, moderate LAE, normal mitral valve with moderate AI.  Repeat echocardiogram obtained on 09/03/2021 showed a drop in the ejection fraction to 30 to 35% with wall motion abnormality, grade 3 diastolic dysfunction, severe akinesis of the LV, mid apical anterior wall, mid apical anteroseptal wall, moderate MR.  Given her advanced age and history of bradycardia, ischemic work-up and guideline directed medical therapy were not recommended.  Patient was seen by Dr. Geraldo Pitter on 09/22/2021, low-dose digoxin was added to her medical regimen to help with heart failure.  Patient was last seen by Dr. Geraldo Pitter on 12/23/2021, at which time she was doing well.  Blood pressure was elevated in the office at 182/98.  Patient reported she was not taking the digoxin, therefore the medication was removed from her med list.  She was in her usual state of health until early this  morning around 3 AM when she woke up with significant shortness of breath.  Prior to going to sleep, she had no issues denies any recent chest pain or worsening dyspnea.  She does admitted that her blood pressure has been quite elevated at home in the 160-170s range.  In route to ED by EMS, patient was noted to have O2 saturation as low as 88% on room air.  She was given nitroglycerin.  Upon arrival in the ED, she was placed on BiPAP, later weaned down to 3 L nasal cannula.  Chest x-ray showed extensive pulmonary infiltrate suggestive of pulmonary edema.  Blood pressure was quite high in the emergency room as well.  TSH normal.  BMP 1356.  Creatinine 1.18, sodium 134, potassium 4.3.  According to patient, she will occasionally miss her home dose of diuretic when she goes to church or other event.  She mostly has been compliant with her medication.  EKG demonstrated normal sinus rhythm with possible anterior and inferior infarct, there was minimal ST elevation in lead V2 and V3.  EKG reviewed with MD, given lack of chest pain and negative troponin, this does not suggest ACS.   Past Medical History:  Diagnosis Date   Acute on chronic systolic congestive heart failure ([place]) 12/01/2011   Aortic regurgitation 09/01/2020   Arteriosclerosis of coronary artery 06/18/2021   Formatting of this note might be different from the original. Formatting of this note might be different from the original. Cardiac cath 12/02/11  Short LM, patent LAD stent with 50% ISR, 95% ostial jailed diagonal stenosis, dominant circumflex with no significant  disease, nondominant RCA with calcified ostium                        2007   Anterior MI                         2007  DES to LAD for occ   Bilateral carotid bruits 11/21/2018   CAD (coronary artery disease)    Cardiac cath 12/02/11  Short LM, patent LAD stent with 50% ISR, 95% ostial jailed diagonal stenosis, dominant circumflex with no significant disease, nondominant RCA with calcified  ostium                        2007   Anterior MI                         2007  DES to LAD for occluded LAD at site of prior J and J stent    Cataract, nuclear 08/22/2012   CHF (congestive heart failure) (HCC)    Chronic systolic congestive heart failure, NYHA class 2 ([place])    Combined form of 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 Added automatically from request for surgery [id number]   Coronary artery disease    Essential hypertension 03/05/2020   Hyperlipidemia    Hypertension    Hypertensive heart disease without CHF    Impacted cerumen of left ear 11/30/2021   Macular retinal puckering, right eye 12/01/2015   Moderate mitral regurgitation 09/22/2021   Nuclear sclerosis of both eyes 04/08/2018   Old anterior myocardial infarction    Open angle with borderline findings, low risk 08/22/2012   Open angle with borderline findings, low risk, bilateral 12/01/2015   Posterior vitreous detachment, right 04/03/2014    Past Surgical History:  Procedure Laterality Date   ABDOMINAL HYSTERECTOMY     LEFT HEART CATHETERIZATION WITH CORONARY ANGIOGRAM N/A 12/02/2011   Procedure: LEFT HEART CATHETERIZATION WITH CORONARY ANGIOGRAM;  Surgeon: Jacolyn Reedy, MD;  Location: [facility] C[facility] LAB;  Service: Cardiovascular;  Laterality: N/A;     Home Medications:  Prior to Admission medications   Medication Sig Start Date End Date Taking? Authorizing Provider  acetaminophen (TYLENOL) 325 MG tablet Take 650 mg by mouth every 6 (six) hours as needed for mild pain.   Yes [provider]  amoxicillin-clavulanate (AUGMENTIN) 875-125 MG tablet Take 1 tablet by mouth 2 (two) times daily. 12/21/21  Yes [provider]  candesartan (ATACAND) 16 MG tablet Take 1 tablet (16 mg total) by mouth daily. Patient taking differently: Take 16 mg by mouth at bedtime. 08/25/21  Yes Revankar, Reita Cliche, MD  furosemide (LASIX) 40 MG tablet TAKE 1 TABLET BY MOUTH  DAILY Patient taking differently: Take 40 mg by mouth daily. 01/18/21  Yes Revankar, Reita Cliche, MD  metoprolol tartrate (LOPRESSOR) 100 MG tablet Take 100 mg by mouth daily.   Yes [provider]  nitroGLYCERIN (NITROSTAT) 0.4 MG SL tablet Place 0.4 mg under the tongue every 5 (five) minutes as needed for chest pain.   Yes [provider]  VITAMIN D PO Take 1 capsule by mouth once a week. Unknown strength   Yes [provider]  clopidogrel (PLAVIX) 75 MG tablet Take 1 tablet (75 mg total) by mouth daily. 02/11/19   Revankar, Reita Cliche, MD    Inpatient Medications:  Scheduled Meds:  clopidogrel  75 mg Oral Daily   enoxaparin (LOVENOX) injection  40 mg Subcutaneous Q24H   [START ON 12/29/2021] furosemide  40 mg Intravenous Daily   irbesartan  300 mg Oral Daily   metoprolol tartrate  100 mg Oral Daily   sodium chloride flush  3 mL Intravenous Q12H   Continuous Infusions:  sodium chloride     nitroGLYCERIN 5 mcg/min (12/28/21 1335)   PRN Meds: sodium chloride, acetaminophen, ondansetron (ZOFRAN) IV, sodium chloride flush  Allergies:    Allergies  Allergen Reactions   Cortisone Other (See Comments)    Blurred vision   Penicillins Other (See Comments)    Has patient had a PCN reaction causing immediate rash, facial/tongue/throat swelling, SOB or lightheadedness with hypotension: UNK Has patient had a PCN reaction causing severe rash involving mucus membranes or skin necrosis: UNK Has patient had a PCN reaction that required hospitalization: UNK Has patient had a PCN reaction occurring within the last 10 years: No If all of the above answers are "NO", then may proceed with Cephalosporin use.    Statins Other (See Comments)    Elevates blood pressure   Ace Inhibitors Cough   Codeine Nausea Only and Other (See Comments)   Hydrocodone-Acetaminophen Nausea Only and Other (See Comments)   Digoxin And Related     dizzy    Social History:   Social History    Socioeconomic History   Marital status: Widowed    Spouse name: Not on file   Number of children: Not on file   Years of education: Not on file   Highest education level: Not on file  Occupational History   Not on file  Tobacco Use   Smoking status: Never   Smokeless tobacco: Never  Substance and Sexual Activity   Alcohol use: No   Drug use: No   Sexual activity: Never  Other Topics Concern   Not on file  Social History Narrative   Widow.  Lives alone   Social Determinants of Health   Financial Resource Strain: Not on file  Food Insecurity: Not on file  Transportation Needs: Not on file  Physical Activity: Not on file  Stress: Not on file  Social Connections: Not on file  Intimate Partner Violence: Not on file    Family History:    Family History  Problem Relation Age of Onset   Heart attack Father    Coronary artery disease Brother        History of CABG   Cancer Brother    Heart failure Mother    Breast cancer Sister      ROS:  Please see the history of present illness.   All other ROS reviewed and negative.     Physical Exam/Data:   Vitals:   12/28/21 1245 12/28/21 1330 12/28/21 1345 12/28/21 1400  BP: (!) 151/84 (!) 147/82 136/65 110/78  Pulse: 100 85 81 81  Resp: (!) 22 10 20 17   Temp:      TempSrc:      SpO2: 97% 99% 99% 98%    Intake/Output Summary (Last 24 hours) at 12/28/2021 1453 Last data filed at 12/28/2021 1156 Gross per 24 hour  Intake --  Output 2400 ml  Net -2400 ml   Last 3 Weights 12/23/2021 09/22/2021 08/25/2021  Weight (lbs) 105 lb 9.6 oz 104 lb 8 oz 105 lb 12.8 oz  Weight (kg) 47.9 kg 47.401 kg 47.991 kg     There is no height or weight  on file to calculate BMI.  General:  Well nourished, well developed, in no acute distress HEENT: normal Neck: no JVD Vascular: No carotid bruits; Distal pulses 2+ bilaterally Cardiac:  normal S1, S2; RRR; no murmur  Lungs:  clear to auscultation bilaterally, no wheezing, rhonchi or rales   Abd: soft, nontender, no hepatomegaly  Ext: no edema Musculoskeletal:  No deformities, BUE and BLE strength normal and equal Skin: warm and dry  Neuro:  CNs 2-12 intact, no focal abnormalities noted Psych:  Normal affect   EKG:  The EKG was personally reviewed and demonstrates: Normal sinus rhythm, Q waves in the septal and inferior leads, questionable ST elevation in V2 and V3 Telemetry:  Telemetry was personally reviewed and demonstrates: Normal sinus rhythm, heart rate initially around 100, now improved to 80s.  Relevant CV Studies:  Echo 09/03/2021  1. Now segmental wall motion abnormalities and diminished EF as compare  to echo from 2020. Left ventricular ejection fraction, by estimation, is  30 to 35%. The left ventricle has moderately decreased function. The left  ventricle demonstrates regional  wall motion abnormalities (see scoring diagram/findings for description).  There is moderate left ventricular hypertrophy. Left ventricular diastolic  parameters are consistent with Grade III diastolic dysfunction  (restrictive). There is severe akinesis of   the left ventricular, mid-apical anterior wall. There is severe akinesis  of the left ventricular, mid-apical anteroseptal wall.   2. Right ventricular systolic function is normal. The right ventricular  size is normal. There is moderately elevated pulmonary artery systolic  pressure.   3. Left atrial size was moderately dilated.   4. The mitral valve is normal in structure. Moderate mitral valve  regurgitation. No evidence of mitral stenosis.   5. The aortic valve is normal in structure. Aortic valve regurgitation is  moderate. No aortic stenosis is present.   6. The inferior vena cava is normal in size with greater than 50%  respiratory variability, suggesting right atrial pressure of 3 mmHg.   Laboratory Data:  High Sensitivity Troponin:   Recent Labs  Lab 12/28/21 0338  TROPONINIHS 12     Chemistry Recent Labs   Lab 12/23/21 1553 12/28/21 0338  NA 139 134*  K 4.3 4.3  CL 102 101  CO2 23 23  GLUCOSE 85 202*  BUN 16 19  CREATININE 0.94 1.18*  CALCIUM 9.9 9.4  GFRNONAA  --  43*  ANIONGAP  --  10    Recent Labs  Lab 12/28/21 0338  PROT 7.4  ALBUMIN 3.2*  AST 113*  ALT 86*  ALKPHOS 111  BILITOT 0.9   Lipids No results for input(s): CHOL, TRIG, HDL, LABVLDL, LDLCALC, CHOLHDL in the last 168 hours.  Hematology Recent Labs  Lab 12/28/21 0338  WBC 8.4  RBC 3.86*  HGB 12.7  HCT 39.2  MCV 101.6*  MCH 32.9  MCHC 32.4  RDW 14.1  PLT 342   Thyroid  Recent Labs  Lab 12/28/21 0947  TSH 1.443    BNP Recent Labs  Lab 12/28/21 0338  BNP 1,356.0*    DDimer No results for input(s): DDIMER in the last 168 hours.   Radiology/Studies:  DG Chest Portable 1 View  Result Date: 12/28/2021 CLINICAL DATA:  Dyspnea EXAM: PORTABLE CHEST 1 VIEW COMPARISON:  08/31/2021 FINDINGS: Pulmonary insufflation is normal and symmetric. Superimposed extensive, diffuse interstitial and airspace infiltrate appears progressive since prior examination in keeping with progressive, moderate to severe pulmonary edema, less likely infection. No pneumothorax or pleural effusion. Cardiac  size is mildly enlarged. Pulmonary vascular redistribution of the lung apices again noted. No acute bone abnormality. IMPRESSION: Extensive pulmonary infiltrate, progressive since prior examination, most suggestive of progressive, moderate to severe pulmonary edema. Electronically Signed   By: Fidela Salisbury M.D.   On: 12/28/2021 04:12     Assessment and Plan:   Acute on chronic systolic heart failure  -Patient presented with paroxysmal nocturnal dyspnea around 3 AM this morning.  -Echocardiogram in 2020 showed normal EF of 60 to 65%, however by November 2022, repeat echocardiogram showed EF down to 35 to 40% with wall motion abnormality, and grade 3 diastolic dysfunction.  -Initial chest x-ray shows significant pulmonary edema.   BNP over 1000.  Patient underwent diuresis with total of 120 mg of IV Lasix this morning.  She has great diuresis.  She is breathing much better.  Agree with continuing 40 mg daily of IV Lasix patient.  Hopefully she will reach euvolemic level in the next 1 to 2 days.  Elevated blood pressure: On candesartan and metoprolol tartrate 100 mg daily at home.  Given LV dysfunction, will switch metoprolol tartrate to 100 mg daily of metoprolol succinate.  Blood pressure improved on IV nitroglycerin, nitroglycerin has been weaned off after diuresis.  CAD: Last cardiac catheterization 2013.  Denies any recent chest pain.  Troponin negative.  On Plavix at home  Hyperlipidemia    Risk Assessment/Risk Scores:        New York Heart Association (NYHA) Functional Class NYHA Class IV        For questions or updates, please contact [facility] Please consult [email or web address] for contact info under    Signed, Almyra Deforest, [place]  12/28/2021 2:53 PM  Personally seen and examined. Agree with above.  88 year old female here with acute systolic heart failure, EF 35 to 40% on most recent check with abnormal EKG previously with J-point elevated ST segments in the precordial leads with T wave inversions, currently somewhat concave ST segment elevation in precordial leads with associated T wave inversions, no chest pain, normal troponin, elevated BNP 1300, creatinine 0.94-1.18 with mildly elevated LFTs compatible with hepatic congestion here with worsening shortness of breath.  After receiving IV Lasix and excellent diuresis, she feels much better.  Breathing more comfortably.  She is now on 3 L of oxygen.  She was also quite hypertensive on admission compatible with hypertensive urgency along with her acute systolic heart failure.  Her blood pressure has responded appropriately after diuresis with decreased into the [id number] systolic.  She was originally placed on IV nitroglycerin and this was discontinued  recently.  Chest x-ray showed marked pulmonary edema bilaterally.  Personally reviewed and interpreted    -Continue with IV Lasix - No need at this point for long-acting nitrates unless blood pressure continues to increase - Continue to monitor creatinine.  Expect increase. - Continue with metoprolol however we will change to succinate given her underlying cardiomyopathy. -Continue with angiotensin receptor blocker. -ST changes on ECG are compatible with underlying cardiomyopathy and do not represent acute STEMI.  We will continue to follow.  Candee Furbish, MD

## 2021-12-28 NOTE — ED Notes (Signed)
EDP at bedside  

## 2021-12-29 ENCOUNTER — Other Ambulatory Visit (HOSPITAL_COMMUNITY): Payer: Self-pay

## 2021-12-29 DIAGNOSIS — I10 Essential (primary) hypertension: Secondary | ICD-10-CM | POA: Diagnosis not present

## 2021-12-29 DIAGNOSIS — J810 Acute pulmonary edema: Secondary | ICD-10-CM | POA: Diagnosis not present

## 2021-12-29 DIAGNOSIS — I5023 Acute on chronic systolic (congestive) heart failure: Secondary | ICD-10-CM | POA: Diagnosis not present

## 2021-12-29 DIAGNOSIS — J9601 Acute respiratory failure with hypoxia: Secondary | ICD-10-CM | POA: Diagnosis not present

## 2021-12-29 DIAGNOSIS — R7401 Elevation of levels of liver transaminase levels: Secondary | ICD-10-CM | POA: Diagnosis not present

## 2021-12-29 DIAGNOSIS — I5043 Acute on chronic combined systolic (congestive) and diastolic (congestive) heart failure: Secondary | ICD-10-CM | POA: Diagnosis not present

## 2021-12-29 LAB — MAGNESIUM: Magnesium: 1.9 mg/dL (ref 1.7–2.4)

## 2021-12-29 LAB — BASIC METABOLIC PANEL
Anion gap: 9 (ref 5–15)
BUN: 28 mg/dL — ABNORMAL HIGH (ref 8–23)
CO2: 27 mmol/L (ref 22–32)
Calcium: 9.1 mg/dL (ref 8.9–10.3)
Chloride: 97 mmol/L — ABNORMAL LOW (ref 98–111)
Creatinine, Ser: 1.19 mg/dL — ABNORMAL HIGH (ref 0.44–1.00)
GFR, Estimated: 43 mL/min — ABNORMAL LOW (ref >60)
Glucose, Bld: 118 mg/dL — ABNORMAL HIGH (ref 70–99)
Potassium: 4.4 mmol/L (ref 3.5–5.1)
Sodium: 133 mmol/L — ABNORMAL LOW (ref 135–145)

## 2021-12-29 MED ORDER — FUROSEMIDE 20 MG PO TABS: 20.0000 mg | ORAL_TABLET | Freq: Every day | ORAL | Status: DC

## 2021-12-29 MED ORDER — IRBESARTAN 75 MG PO TABS
75.0000 mg | ORAL_TABLET | Freq: Every day | ORAL | Status: DC
Start: 2021-12-30 — End: 2021-12-29

## 2021-12-29 MED ORDER — METOPROLOL SUCCINATE ER 100 MG PO TB24
100.0000 mg | ORAL_TABLET | Freq: Every day | ORAL | 2 refills | Status: DC
  Filled 2021-12-29: qty 30, 30d supply, fill #0

## 2021-12-29 MED ORDER — FUROSEMIDE 20 MG PO TABS
20.0000 mg | ORAL_TABLET | Freq: Every day | ORAL | 3 refills | Status: DC
  Filled 2021-12-29: qty 30, 30d supply, fill #0

## 2021-12-29 MED ORDER — CANDESARTAN CILEXETIL 16 MG PO TABS
8.0000 mg | ORAL_TABLET | Freq: Every day | ORAL | 3 refills | Status: DC
  Filled 2021-12-29: qty 30, 60d supply, fill #0

## 2021-12-29 NOTE — ED Notes (Signed)
Called 6E to initiate purple man x1 ?

## 2021-12-29 NOTE — Care Management CC44 (Signed)
Condition Code 44 Documentation Completed ? ?Patient Details  ?Name: Kristine Valdez ?MRN: [id number] ?Date of Birth: 09/08/1926 ? ? ?Condition Code 44 given:  Yes ?Patient signature on Condition Code 44 notice:  Yes ?Documentation of 2 MD's agreement:  Yes ?Code 44 added to claim:  Yes ? ? ? ?Bethena Roys, RN ?12/29/2021, 12:27 PM ? ?

## 2021-12-29 NOTE — Discharge Summary (Signed)
PATIENT DETAILS Name: Kristine Valdez Age: 88 y.o. Sex: female Date of Birth: [birth year] MRN: [id number]. Admitting Physician: Norval Morton, MD [id number], Curt Jews, MD  Admit Date: 12/28/2021 Discharge date: 12/29/2021  Recommendations for Outpatient Follow-up:  Follow up with PCP in 1-2 weeks Please obtain CMP/CBC in one week Please ensure follow-up with cardiology.  Admitted From:  Home  Disposition: Home   Discharge Condition: good  CODE STATUS:   Code Status: Full Code   Diet recommendation:  Diet Order             Diet - low sodium heart healthy           Diet Heart Room service appropriate? Yes; Fluid consistency: Thin; Fluid restriction: 1500 mL Fluid  Diet effective now                    Brief Summary: Patient is a 88 year old female with chronic HFrEF-who presented with acute hypoxic respiratory failure due to decompensated systolic heart failure.  See below for further details.  Brief Hospital Course: Acute hypoxic respiratory failure due to HFrEF exacerbation: Rapidly improved with diuresis/IV nitroglycerin.  Volume status has stabilized-she feels she is back to her baseline-per daughter-ambulating in the room without any assistance.  Discussed with cardiology-Dr. Sharrell Ku to discharge today-patient will need close outpatient follow-up with a cardiologist.  CKD stage IIIa: Creatinine close to baseline.  Hyponatremia: Mild-of no clinical significance.  Mild transaminitis: Probably congestive hepatitis-stable for outpatient follow-up-repeat labs at next visit with PCP or cardiology.  HTN: Initially required IV nitroglycerin infusion-however with further improvement in volume status-BP is now soft this morning, medications adjusted-see medication list below.   CAD: No anginal symptoms-continue Plavix  Mild aortic stenosis: Continue outpatient follow-up  BMI: Estimated body mass index is 15.77 kg/m as calculated from the following:    Height as of this encounter: 5\' 7"  (1.702 m).   Weight as of this encounter: 45.7 kg.   Discharge Diagnoses:  Principal Problem:   Acute pulmonary edema (HCC) Active Problems:   CAD (coronary artery disease)   Mixed dyslipidemia   Acute respiratory failure with hypoxia secondary to acute on chronic combined systolic and diastolic CHF (congestive heart failure) (HCC)   CHF (congestive heart failure) (HCC)   Essential hypertension   Renal insufficiency superimposed on chronic kidney disease stage IIIa   Transaminitis   Hyponatremia   Acute respiratory failure with hypoxia [facility])   Discharge Instructions:  Activity:  As tolerated with Full fall precautions use walker/cane & assistance as needed  Discharge Instructions     (HEART FAILURE PATIENTS) Call MD:  Anytime you have any of the following symptoms: 1) 3 pound weight gain in 24 hours or 5 pounds in 1 week 2) shortness of breath, with or without a dry hacking cough 3) swelling in the hands, feet or stomach 4) if you have to sleep on extra pillows at night in order to breathe.   Complete by: As directed    Diet - low sodium heart healthy   Complete by: As directed    Discharge instructions   Complete by: As directed    Follow with Primary MD  Leonard Downing, MD in 1-2 weeks  Please get a complete blood count and chemistry panel checked by your Primary MD at your next visit, and again as instructed by your Primary MD.  Get Medicines reviewed and adjusted: Please take all your medications with you for your next visit with your Primary  MD  Laboratory/radiological data: Please request your Primary MD to go over all hospital tests and procedure/radiological results at the follow up, please ask your Primary MD to get all Hospital records sent to his/her office.  In some cases, they will be blood work, cultures and biopsy results pending at the time of your discharge. Please request that your primary care M.D. follows up on  these results.  Also Note the following: If you experience worsening of your admission symptoms, develop shortness of breath, life threatening emergency, suicidal or homicidal thoughts you must seek medical attention immediately by calling 911 or calling your MD immediately  if symptoms less severe.  You must read complete instructions/literature along with all the possible adverse reactions/side effects for all the Medicines you take and that have been prescribed to you. Take any new Medicines after you have completely understood and accpet all the possible adverse reactions/side effects.   Do not drive when taking Pain medications or sleeping medications (Benzodaizepines)  Do not take more than prescribed Pain, Sleep and Anxiety Medications. It is not advisable to combine anxiety,sleep and pain medications without talking with your primary care practitioner  Special Instructions: If you have smoked or chewed Tobacco  in the last 2 yrs please stop smoking, stop any regular Alcohol  and or any Recreational drug use.  Wear Seat belts while driving.  Please note: You were cared for by a hospitalist during your hospital stay. Once you are discharged, your primary care physician will handle any further medical issues. Please note that NO REFILLS for any discharge medications will be authorized once you are discharged, as it is imperative that you return to your primary care physician (or establish a relationship with a primary care physician if you do not have one) for your post hospital discharge needs so that they can reassess your need for medications and monitor your lab values.   Increase activity slowly   Complete by: As directed       Allergies as of 12/29/2021       Reactions   Cortisone Other (See Comments)   Blurred vision   Penicillins Other (See Comments)   Has patient had a PCN reaction causing immediate rash, facial/tongue/throat swelling, SOB or lightheadedness with hypotension:  UNK Has patient had a PCN reaction causing severe rash involving mucus membranes or skin necrosis: UNK Has patient had a PCN reaction that required hospitalization: UNK Has patient had a PCN reaction occurring within the last 10 years: No If all of the above answers are "NO", then may proceed with Cephalosporin use.   Statins Other (See Comments)   Elevates blood pressure   Ace Inhibitors Cough   Codeine Nausea Only, Other (See Comments)   Hydrocodone-acetaminophen Nausea Only, Other (See Comments)   Digoxin And Related    dizzy        Medication List     STOP taking these medications    amoxicillin-clavulanate 875-125 MG tablet Commonly known as: AUGMENTIN   metoprolol tartrate 100 MG tablet Commonly known as: LOPRESSOR       TAKE these medications    acetaminophen 325 MG tablet Commonly known as: TYLENOL Take 650 mg by mouth every 6 (six) hours as needed for mild pain.   candesartan 16 MG tablet Commonly known as: ATACAND Take 0.5 tablets (8 mg total) by mouth daily. What changed: how much to take   clopidogrel 75 MG tablet Commonly known as: PLAVIX Take 1 tablet (75 mg total) by mouth daily.  furosemide 40 MG tablet Commonly known as: LASIX Take 0.5 tablets (20 mg total) by mouth daily. What changed: how much to take   metoprolol succinate 100 MG 24 hr tablet Commonly known as: TOPROL-XL Take 1 tablet (100 mg total) by mouth daily. Take with or immediately following a meal. Start taking on: December 30, 2021   nitroGLYCERIN 0.4 MG SL tablet Commonly known as: NITROSTAT Place 0.4 mg under the tongue every 5 (five) minutes as needed for chest pain.   VITAMIN D PO Take 1 capsule by mouth once a week. Unknown strength        Follow-up Information     Leonard Downing, MD. Schedule an appointment as soon as possible for a visit in 1 week(s).   Specialty: Family Medicine Contact information: [place]  [address] [phone]         Revankar, Reita Cliche, MD Follow up in 1 week(s).   Specialty: Cardiology Contact information: [address] [place] [address] [phone]                Allergies  Allergen Reactions   Cortisone Other (See Comments)    Blurred vision   Penicillins Other (See Comments)    Has patient had a PCN reaction causing immediate rash, facial/tongue/throat swelling, SOB or lightheadedness with hypotension: UNK Has patient had a PCN reaction causing severe rash involving mucus membranes or skin necrosis: UNK Has patient had a PCN reaction that required hospitalization: UNK Has patient had a PCN reaction occurring within the last 10 years: No If all of the above answers are "NO", then may proceed with Cephalosporin use.    Statins Other (See Comments)    Elevates blood pressure   Ace Inhibitors Cough   Codeine Nausea Only and Other (See Comments)   Hydrocodone-Acetaminophen Nausea Only and Other (See Comments)   Digoxin And Related     dizzy     Other Procedures/Studies: DG Chest Portable 1 View  Result Date: 12/28/2021 CLINICAL DATA:  Dyspnea EXAM: PORTABLE CHEST 1 VIEW COMPARISON:  08/31/2021 FINDINGS: Pulmonary insufflation is normal and symmetric. Superimposed extensive, diffuse interstitial and airspace infiltrate appears progressive since prior examination in keeping with progressive, moderate to severe pulmonary edema, less likely infection. No pneumothorax or pleural effusion. Cardiac size is mildly enlarged. Pulmonary vascular redistribution of the lung apices again noted. No acute bone abnormality. IMPRESSION: Extensive pulmonary infiltrate, progressive since prior examination, most suggestive of progressive, moderate to severe pulmonary edema. Electronically Signed   By: Fidela Salisbury M.D.   On: 12/28/2021 04:12     TODAY-DAY OF DISCHARGE:  Subjective:   Kristine Valdez today has no headache,no chest abdominal pain,no new weakness tingling  or numbness, feels much better wants to go home today.   Objective:   Blood pressure 140/86, pulse (!) 102, temperature 97.8 F (36.6 C), temperature source Oral, resp. rate 20, height 5\' 7"  (1.702 m), weight 45.7 kg, SpO2 100 %.  Intake/Output Summary (Last 24 hours) at 12/29/2021 1153 Last data filed at 12/29/2021 1000 Gross per 24 hour  Intake 240 ml  Output 1000 ml  Net -760 ml   Filed Weights   12/29/21 1103  Weight: 45.7 kg    Exam: Awake Alert, Oriented *3, No new F.N deficits, Normal affect Armstrong.AT,PERRAL Supple Neck,No JVD, No cervical lymphadenopathy appriciated.  Symmetrical Chest wall movement, Good air movement bilaterally, CTAB RRR,No Gallops,Rubs or new Murmurs, No Parasternal Heave +ve B.Sounds, Abd Soft, Non tender, No organomegaly  appriciated, No rebound -guarding or rigidity. No Cyanosis, Clubbing or edema, No new Rash or bruise   PERTINENT RADIOLOGIC STUDIES: DG Chest Portable 1 View  Result Date: 12/28/2021 CLINICAL DATA:  Dyspnea EXAM: PORTABLE CHEST 1 VIEW COMPARISON:  08/31/2021 FINDINGS: Pulmonary insufflation is normal and symmetric. Superimposed extensive, diffuse interstitial and airspace infiltrate appears progressive since prior examination in keeping with progressive, moderate to severe pulmonary edema, less likely infection. No pneumothorax or pleural effusion. Cardiac size is mildly enlarged. Pulmonary vascular redistribution of the lung apices again noted. No acute bone abnormality. IMPRESSION: Extensive pulmonary infiltrate, progressive since prior examination, most suggestive of progressive, moderate to severe pulmonary edema. Electronically Signed   By: Fidela Salisbury M.D.   On: 12/28/2021 04:12     PERTINENT LAB RESULTS: CBC: Recent Labs    12/28/21 0338  WBC 8.4  HGB 12.7  HCT 39.2  PLT 342   CMET CMP     Component Value Date/Time   NA 133 (L) 12/29/2021 0615   NA 139 12/23/2021 1553   K 4.4 12/29/2021 0615   CL 97 (L) 12/29/2021  0615   CO2 27 12/29/2021 0615   GLUCOSE 118 (H) 12/29/2021 0615   BUN 28 (H) 12/29/2021 0615   BUN 16 12/23/2021 1553   CREATININE 1.19 (H) 12/29/2021 0615   CALCIUM 9.1 12/29/2021 0615   PROT 7.4 12/28/2021 0338   PROT 7.3 08/25/2021 0858   ALBUMIN 3.2 (L) 12/28/2021 0338   ALBUMIN 4.0 08/25/2021 0858   AST 113 (H) 12/28/2021 0338   ALT 86 (H) 12/28/2021 0338   ALKPHOS 111 12/28/2021 0338   BILITOT 0.9 12/28/2021 0338   BILITOT 0.7 08/25/2021 0858   GFRNONAA 43 (L) 12/29/2021 0615   GFRAA 47 (L) 03/05/2020 1112    GFR Estimated Creatinine Clearance: 21.3 mL/min (A) (by C-G formula based on SCr of 1.19 mg/dL (H)). No results for input(s): LIPASE, AMYLASE in the last 72 hours. No results for input(s): CKTOTAL, CKMB, CKMBINDEX, TROPONINI in the last 72 hours. Invalid input(s): POCBNP No results for input(s): DDIMER in the last 72 hours. No results for input(s): HGBA1C in the last 72 hours. No results for input(s): CHOL, HDL, LDLCALC, TRIG, CHOLHDL, LDLDIRECT in the last 72 hours. Recent Labs    12/28/21 0947  TSH 1.443   No results for input(s): VITAMINB12, FOLATE, FERRITIN, TIBC, IRON, RETICCTPCT in the last 72 hours. Coags: No results for input(s): INR in the last 72 hours.  Invalid input(s): PT Microbiology: Recent Results (from the past 240 hour(s))  Resp Panel by RT-PCR (Flu A&B, Covid) Nasopharyngeal Swab     Status: None   Collection Time: 12/28/21  3:38 AM   Specimen: Nasopharyngeal Swab; Nasopharyngeal(NP) swabs in vial transport medium  Result Value Ref Range Status   SARS Coronavirus 2 by RT PCR NEGATIVE NEGATIVE Final    Comment: (NOTE) SARS-CoV-2 target nucleic acids are NOT DETECTED.  The SARS-CoV-2 RNA is generally detectable in upper respiratory specimens during the acute phase of infection. The lowest concentration of SARS-CoV-2 viral copies this assay can detect is 138 copies/mL. A negative result does not preclude SARS-Cov-2 infection and should  not be used as the sole basis for treatment or other patient management decisions. A negative result may occur with  improper specimen collection/handling, submission of specimen other than nasopharyngeal swab, presence of viral mutation(s) within the areas targeted by this assay, and inadequate number of viral copies(<138 copies/mL). A negative result must be combined with clinical observations, patient history,  and epidemiological information. The expected result is Negative.  Fact Sheet for Patients:  EntrepreneurPulse.com.au  Fact Sheet for Healthcare Providers:  IncredibleEmployment.be  This test is no t yet approved or cleared by the [place] FDA and  has been authorized for detection and/or diagnosis of SARS-CoV-2 by FDA under an Emergency Use Authorization (EUA). This EUA will remain  in effect (meaning this test can be used) for the duration of the COVID-19 declaration under Section 564(b)(1) of the Act, 21 U.S.C.section 360bbb-3(b)(1), unless the authorization is terminated  or revoked sooner.       Influenza A by PCR NEGATIVE NEGATIVE Final   Influenza B by PCR NEGATIVE NEGATIVE Final    Comment: (NOTE) The Xpert Xpress SARS-CoV-2/FLU/RSV plus assay is intended as an aid in the diagnosis of influenza from Nasopharyngeal swab specimens and should not be used as a sole basis for treatment. Nasal washings and aspirates are unacceptable for Xpert Xpress SARS-CoV-2/FLU/RSV testing.  Fact Sheet for Patients: EntrepreneurPulse.com.au  Fact Sheet for Healthcare Providers: IncredibleEmployment.be  This test is not yet approved or cleared by the [place] FDA and has been authorized for detection and/or diagnosis of SARS-CoV-2 by FDA under an Emergency Use Authorization (EUA). This EUA will remain in effect (meaning this test can be used) for the duration of the COVID-19 declaration under  Section 564(b)(1) of the Act, 21 U.S.C. section 360bbb-3(b)(1), unless the authorization is terminated or revoked.  Performed at [facility], [place] [address] [place], [place] [address]     FURTHER DISCHARGE INSTRUCTIONS:  Get Medicines reviewed and adjusted: Please take all your medications with you for your next visit with your Primary MD  Laboratory/radiological data: Please request your Primary MD to go over all hospital tests and procedure/radiological results at the follow up, please ask your Primary MD to get all Hospital records sent to his/her office.  In some cases, they will be blood work, cultures and biopsy results pending at the time of your discharge. Please request that your primary care M.D. goes through all the records of your hospital data and follows up on these results.  Also Note the following: If you experience worsening of your admission symptoms, develop shortness of breath, life threatening emergency, suicidal or homicidal thoughts you must seek medical attention immediately by calling 911 or calling your MD immediately  if symptoms less severe.  You must read complete instructions/literature along with all the possible adverse reactions/side effects for all the Medicines you take and that have been prescribed to you. Take any new Medicines after you have completely understood and accpet all the possible adverse reactions/side effects.   Do not drive when taking Pain medications or sleeping medications (Benzodaizepines)  Do not take more than prescribed Pain, Sleep and Anxiety Medications. It is not advisable to combine anxiety,sleep and pain medications without talking with your primary care practitioner  Special Instructions: If you have smoked or chewed Tobacco  in the last 2 yrs please stop smoking, stop any regular Alcohol  and or any Recreational drug use.  Wear Seat belts while driving.  Please note: You were cared for by a hospitalist during  your hospital stay. Once you are discharged, your primary care physician will handle any further medical issues. Please note that NO REFILLS for any discharge medications will be authorized once you are discharged, as it is imperative that you return to your primary care physician (or establish a relationship with a primary care physician if you do not  have one) for your post hospital discharge needs so that they can reassess your need for medications and monitor your lab values.  Total Time spent coordinating discharge including counseling, education and face to face time equals greater than 30 minutes.  SignedOren Binet 12/29/2021 11:53 AM

## 2021-12-29 NOTE — Progress Notes (Signed)
Explained discharge summary to patient's daughter Gidget Quizhpi. Made aware of patient's follow-up appointments and next medication administration times. Removed IV and telemetry. CCMD was notified. Stopping to pickup patient's TOC Meds when taken down for d/c. All questions were answered. Patient's family voiced all needs were met.  ?

## 2021-12-29 NOTE — Progress Notes (Signed)
? ?Progress Note ? ?Patient Name: Kristine Valdez ?Date of Encounter: 12/29/2021 ? ?[facility] Cardiologist: Garwin Brothers, MD  ? ?Subjective  ? ?Breathing improved.  Lives with her son.  She drives locally. Doing well. BP improved ? ?Inpatient Medications  ?  ?Scheduled Meds: ? clopidogrel  75 mg Oral Daily  ? enoxaparin (LOVENOX) injection  40 mg Subcutaneous Q24H  ? furosemide  40 mg Intravenous Daily  ? irbesartan  300 mg Oral Daily  ? metoprolol succinate  100 mg Oral Daily  ? sodium chloride flush  3 mL Intravenous Q12H  ? ?Continuous Infusions: ? sodium chloride    ? ?PRN Meds: ?sodium chloride, acetaminophen, ondansetron (ZOFRAN) IV, sodium chloride flush  ? ?Vital Signs  ?  ?Vitals:  ? 12/29/21 0715 12/29/21 0718 12/29/21 0900 12/29/21 1000  ?BP:   (!) 114/58 (!) 97/56  ?Pulse: 89  94 98  ?Resp: 19  18 (!) 21  ?Temp:  97.8 ?F (36.6 ?C)    ?TempSrc:  Oral    ?SpO2: 97%  98% 94%  ? ? ?Intake/Output Summary (Last 24 hours) at 12/29/2021 1013 ?Last data filed at 12/28/2021 1156 ?Gross per 24 hour  ?Intake --  ?Output 1000 ml  ?Net -1000 ml  ? ?Last 3 Weights 12/23/2021 09/22/2021 08/25/2021  ?Weight (lbs) 105 lb 9.6 oz 104 lb 8 oz 105 lb 12.8 oz  ?Weight (kg) 47.9 kg 47.401 kg 47.991 kg  ?   ? ?Telemetry  ?  ?Normal sinus rhythm- Personally Reviewed ? ?ECG  ?  ?Repolarization abnormality seen in precordial leads sinus rhythm- Personally Reviewed ? ?Physical Exam  ? ?GEN: Thin, in no distress ?Neck: No JVD ?Cardiac: RRR, no murmurs, rubs, or gallops.  ?Respiratory: Clear to auscultation bilaterally. ?GI: Soft, nontender, non-distended  ?MS: No edema; No deformity. ?Neuro:  Nonfocal  ?Psych: Normal affect  ? ?Labs  ?  ?High Sensitivity Troponin:   ?Recent Labs  ?Lab 12/28/21 ?5631  ?TROPONINIHS 12  ?   ?Chemistry ?Recent Labs  ?Lab 12/23/21 ?1553 12/28/21 ?4970 12/29/21 ?0615  ?NA 139 134* 133*  ?K 4.3 4.3 4.4  ?CL 102 101 97*  ?CO2 23 23 27   ?GLUCOSE 85 202* 118*  ?BUN 16 19 28*  ?CREATININE 0.94 1.18* 1.19*   ?CALCIUM 9.9 9.4 9.1  ?MG  --   --  1.9  ?PROT  --  7.4  --   ?ALBUMIN  --  3.2*  --   ?AST  --  113*  --   ?ALT  --  86*  --   ?ALKPHOS  --  111  --   ?BILITOT  --  0.9  --   ?GFRNONAA  --  43* 43*  ?ANIONGAP  --  10 9  ?  ?Lipids No results for input(s): CHOL, TRIG, HDL, LABVLDL, LDLCALC, CHOLHDL in the last 168 hours.  ?Hematology ?Recent Labs  ?Lab 12/28/21 ?12/30/21  ?WBC 8.4  ?RBC 3.86*  ?HGB 12.7  ?HCT 39.2  ?MCV 101.6*  ?MCH 32.9  ?MCHC 32.4  ?RDW 14.1  ?PLT 342  ? ?Thyroid  ?Recent Labs  ?Lab 12/28/21 ?12/30/21  ?TSH 1.443  ?  ?BNP ?Recent Labs  ?Lab 12/28/21 ?12/30/21  ?BNP 1,356.0*  ?  ?DDimer No results for input(s): DDIMER in the last 168 hours.  ? ?Radiology  ?  ?DG Chest Portable 1 View ? ?Result Date: 12/28/2021 ?CLINICAL DATA:  Dyspnea EXAM: PORTABLE CHEST 1 VIEW COMPARISON:  08/31/2021 FINDINGS: Pulmonary insufflation is normal and symmetric.  Superimposed extensive, diffuse interstitial and airspace infiltrate appears progressive since prior examination in keeping with progressive, moderate to severe pulmonary edema, less likely infection. No pneumothorax or pleural effusion. Cardiac size is mildly enlarged. Pulmonary vascular redistribution of the lung apices again noted. No acute bone abnormality. IMPRESSION: Extensive pulmonary infiltrate, progressive since prior examination, most suggestive of progressive, moderate to severe pulmonary edema. Electronically Signed   By: Helyn Numbers M.D.   On: 12/28/2021 04:12   ? ?Cardiac Studies  ? ?Echo 2022-EF 30 to 35%. ?Repeat pending. ? ?Patient Profile  ?   ?88 y.o. female here with acute on chronic systolic heart failure, elevated blood pressure compatible with hypertensive urgency, coronary artery disease, hyperlipidemia ? ?Assessment & Plan  ?  ?Acute on chronic systolic heart failure ?- Prior EF in November 2022 35%.  Repeat echocardiogram pending. ?- Initial chest x-ray pulmonary edema.  Personally reviewed and interpreted.  Good overall diuresis and rapid  improvement of respiratory status.  Currently on Lasix 40 mg IV daily. Will change to lasix 20mg  PO QD. ?-Metoprolol succinate 100mg  ?- Currently on irbesartan 300 mg a day here in the hospital. With low BP we will decrease to 75mg .  We could always consider Entresto in the future, especially if hypertension becomes an issue.  In addition, could consider low-dose spironolactone as well. ?-Most recent blood pressure 97/56.  Highest was 164/91.  With aggressive diuresis, I am unsure if she will be able to tolerate Entresto. ?-Creatinine stable at 1.19 today potassium 4.4. ? ?Hypertensive urgency ?- Blood pressure now controlled.  She is off of IV nitroglycerin. ? ?Coronary artery disease ?- Prior cardiac catheterization 2013.  Overall doing well.  Plavix.  She is not currently on statin therapy.  Perhaps secondary to her age 56. ? ?Hyponatremia ?- In part secondary to underlying heart failure. ? ?Mild aortic stenosis ?--should be of little clinical concern at this point ? ?I am comfortable with her DC. She did ask if she could follow up a little closer to home up here. For now would continue with Dr. .  ? ?  ? ?For questions or updates, please contact [facility] ?Please consult [email or web address] for contact info under  ? ?  ?   ?Signed, ?2014, MD  ?12/29/2021, 10:13 AM   ? ?

## 2021-12-29 NOTE — Care Management Obs Status (Signed)
MEDICARE OBSERVATION STATUS NOTIFICATION ? ? ?Patient Details  ?Name: Kristine Valdez ?MRN: [id number] ?Date of Birth: 08-Feb-1926 ? ? ?Medicare Observation Status Notification Given:  Yes ? ? ? ?Gala Lewandowsky, RN ?12/29/2021, 12:27 PM ?

## 2021-12-29 NOTE — Progress Notes (Signed)
Heart Failure Stewardship Pharmacist Progress Note ? ? ?PCP: Kaleen Mask, MD ?PCP-Cardiologist: Garwin Brothers, MD  ? ? ?HPI:  ?88 yo F with PMH of CAD, HTN, HLD, CHF, and moderate MR. She presented to the ED on 2/28 with shortness of breath and elevated BP. CXR with progressive moderate to severe pulmonary edema. Her last ECHO was done 09/03/21 and LVEF was 30-35% and G3DD. ? ?Current HF Medications: ?Diuretic: furosemide 20 mg PO daily ?Beta Blocker: metoprolol XL 100 mg daily ?ACE/ARB/ARNI: irbesartan 75 mg daily ? ?Prior to admission HF Medications: ?Diuretic: furosemide 40 mg daily ?Beta blocker: metoprolol tartrate 100 mg daily ?ACE/ARB/ARNI: candesartan 16 mg daily ? ?Pertinent Lab Values: ?Serum creatinine 1.19, BUN 28, Potassium 4.4, Sodium 133, BNP 1356, Magnesium 1.9, A1c 5.2  ? ?Vital Signs: ?Weight: 100 lbs (admission weight: 100 lbs) ?Blood pressure: 90/60-140/60s  ?Heart rate: 80-100s  ?I/O: -2.4L yesterday; net -2.2L ? ?Medication Assistance / Insurance Benefits Check: ?Does the patient have prescription insurance?  Yes ?Type of insurance plan: [facility] Medicare ? ?Outpatient Pharmacy:  ?Prior to admission outpatient pharmacy: [facility] ?Is the patient willing to use [facility] TOC pharmacy at discharge? Yes ?Is the patient willing to transition their outpatient pharmacy to utilize a [facility] outpatient pharmacy?   Pending ?  ? ?Assessment: ?1. Acute on chronic systolic CHF (EF 77-82%), due to presumed NICM - no LHC since EF drop and known past CAD. NYHA class III symptoms. ?- Continue furosemide 20 mg PO daily ?- Continue metoprolol XL 100 mg daily - was taking metoprolol tartrate 100 mg daily PTA (half the dose equivalent of current dose) ?- Continue ARB - consider transitioning to Entresto if BP remains elevated ?- Consider spironolactone and SGLT2i prior to discharge ?  ?Plan: ?1) Medication changes recommended at this time: ?- Continue current regimen pending BP stabilization  ? ?2)  Patient assistance: ?- Entresto copay $47 ?- Farxiga/Jardiance copay $47 ? ?3)  Education  ?- To be completed prior to discharge ? ?Sharen Hones, PharmD, BCPS ?Heart Failure Stewardship Pharmacist ?Phone [phone] ? ? ?

## 2022-01-06 ENCOUNTER — Other Ambulatory Visit (HOSPITAL_COMMUNITY): Payer: Self-pay

## 2022-01-10 NOTE — Progress Notes (Incomplete)
HEART & VASCULAR TRANSITION OF CARE CONSULT NOTE     Referring Physician:Dr Ghimire Primary Care: Dr Jeannetta Nap  Primary Cardiologist: Dr Josiah Lobo  HPI: Referred to clinic by Dr Jerral Ralph for heart failure consultation.   Ms Kristine Valdez is a 65 year od with a history of AS, HFrEF. HTN, moderate MR, and CAD.   Admitted   Cardiac Testing  08/2021 Echo -WMA. EF 30-35% . Moderate LVHGrade IIIDD. Severe AK of LV and mid apical anteroseptal wall. RV normal. Moderate MVR. Aortic valve regurgitation, moderate.   2020 Echo- EF 60%   Review of Systems: [y] = yes, [ ]  = no   General: Weight gain [ ] ; Weight loss [ ] ; Anorexia [ ] ; Fatigue [ ] ; Fever [ ] ; Chills [ ] ; Weakness [ ]   Cardiac: Chest pain/pressure [ ] ; Resting SOB [ ] ; Exertional SOB [ ] ; Orthopnea [ ] ; Pedal Edema [ ] ; Palpitations [ ] ; Syncope [ ] ; Presyncope [ ] ; Paroxysmal nocturnal dyspnea[ ]   Pulmonary: Cough [ ] ; Wheezing[ ] ; Hemoptysis[ ] ; Sputum [ ] ; Snoring [ ]   GI: Vomiting[ ] ; Dysphagia[ ] ; Melena[ ] ; Hematochezia [ ] ; Heartburn[ ] ; Abdominal pain [ ] ; Constipation [ ] ; Diarrhea [ ] ; BRBPR [ ]   GU: Hematuria[ ] ; Dysuria [ ] ; Nocturia[ ]   Vascular: Pain in legs with walking [ ] ; Pain in feet with lying flat [ ] ; Non-healing sores [ ] ; Stroke [ ] ; TIA [ ] ; Slurred speech [ ] ;  Neuro: Headaches[ ] ; Vertigo[ ] ; Seizures[ ] ; Paresthesias[ ] ;Blurred vision [ ] ; Diplopia [ ] ; Vision changes [ ]   Ortho/Skin: Arthritis [ ] ; Joint pain [ ] ; Muscle pain [ ] ; Joint swelling [ ] ; Back Pain [ ] ; Rash [ ]   Psych: Depression[ ] ; Anxiety[ ]   Heme: Bleeding problems [ ] ; Clotting disorders [ ] ; Anemia [ ]   Endocrine: Diabetes [ ] ; Thyroid dysfunction[ ]    Past Medical History:  Diagnosis Date   Acute on chronic systolic congestive heart failure (HCC) 12/01/2011   Aortic regurgitation 09/01/2020   Arteriosclerosis of coronary artery 06/18/2021   Formatting of this note might be different from the original. Formatting of this note might be  different from the original. Cardiac cath 12/02/11  Short LM, patent LAD stent with 50% ISR, 95% ostial jailed diagonal stenosis, dominant circumflex with no significant disease, nondominant RCA with calcified ostium                        2007   Anterior MI                         2007  DES to LAD for occ   Bilateral carotid bruits 11/21/2018   CAD (coronary artery disease)    Cardiac cath 12/02/11  Short LM, patent LAD stent with 50% ISR, 95% ostial jailed diagonal stenosis, dominant circumflex with no significant disease, nondominant RCA with calcified ostium                        2007   Anterior MI                         2007  DES to LAD for occluded LAD at site of prior J and J stent    Cataract, nuclear 08/22/2012   CHF (congestive heart failure) (HCC)    Chronic systolic congestive heart failure, NYHA class 2 (HCC)  Combined form of 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 Added automatically from request for surgery [id number]   Coronary artery disease    Essential hypertension 03/05/2020   Hyperlipidemia    Hypertension    Hypertensive heart disease without CHF    Impacted cerumen of left ear 11/30/2021   Macular retinal puckering, right eye 12/01/2015   Moderate mitral regurgitation 09/22/2021   Nuclear sclerosis of both eyes 04/08/2018   Old anterior myocardial infarction    Open angle with borderline findings, low risk 08/22/2012   Open angle with borderline findings, low risk, bilateral 12/01/2015   Posterior vitreous detachment, right 04/03/2014    Current Outpatient Medications  Medication Sig Dispense Refill   acetaminophen (TYLENOL) 325 MG tablet Take 650 mg by mouth every 6 (six) hours as needed for mild pain.     candesartan (ATACAND) 16 MG tablet Take 0.5 tablets (8 mg total) by mouth daily. 30 tablet 3   clopidogrel (PLAVIX) 75 MG tablet Take 1 tablet (75 mg total) by mouth daily. 90 tablet 1   furosemide (LASIX) 20  MG tablet Take 1 tablet (20 mg total) by mouth daily. 30 tablet 3   metoprolol succinate (TOPROL-XL) 100 MG 24 hr tablet Take 1 tablet (100 mg total) by mouth daily. Take with or immediately following a meal. 30 tablet 2   nitroGLYCERIN (NITROSTAT) 0.4 MG SL tablet Place 0.4 mg under the tongue every 5 (five) minutes as needed for chest pain.     VITAMIN D PO Take 1 capsule by mouth once a week. Unknown strength     No current facility-administered medications for this visit.    Allergies  Allergen Reactions   Cortisone Other (See Comments)    Blurred vision   Penicillins Other (See Comments)    Has patient had a PCN reaction causing immediate rash, facial/tongue/throat swelling, SOB or lightheadedness with hypotension: UNK Has patient had a PCN reaction causing severe rash involving mucus membranes or skin necrosis: UNK Has patient had a PCN reaction that required hospitalization: UNK Has patient had a PCN reaction occurring within the last 10 years: No If all of the above answers are "NO", then may proceed with Cephalosporin use.    Statins Other (See Comments)    Elevates blood pressure   Ace Inhibitors Cough   Codeine Nausea Only and Other (See Comments)   Hydrocodone-Acetaminophen Nausea Only and Other (See Comments)   Digoxin And Related     dizzy      Social History   Socioeconomic History   Marital status: Widowed    Spouse name: Not on file   Number of children: 11   Years of education: Not on file   Highest education level: Not on file  Occupational History   Occupation: retired  Tobacco Use   Smoking status: Never   Smokeless tobacco: Never  Vaping Use   Vaping Use: Never used  Substance and Sexual Activity   Alcohol use: No   Drug use: No   Sexual activity: Never  Other Topics Concern   Not on file  Social History Narrative   Widow.  Lives alone   Social Determinants of Health   Financial Resource Strain: Low Risk    Difficulty of Paying Living  Expenses: Not hard at all  Food Insecurity: No Food Insecurity   Worried About Programme researcher, broadcasting/film/videounning Out of Food in the Last Year: Never true   [employer] Out of [employer]  in the Last Year: Never true  Transportation Needs: No Transportation Needs   Lack of Transportation (Medical): No   Lack of Transportation (Non-Medical): No  Physical Activity: Not on file  Stress: Not on file  Social Connections: Not on file  Intimate Partner Violence: Not on file      Family History  Problem Relation Age of Onset   Heart attack Father    Coronary artery disease Brother        History of CABG   Cancer Brother    Heart failure Mother    Breast cancer Sister     There were no vitals filed for this visit.  PHYSICAL EXAM: General:  Well appearing. No respiratory difficulty HEENT: normal Neck: supple. no JVD. Carotids 2+ bilat; no bruits. No lymphadenopathy or thryomegaly appreciated. Cor: PMI nondisplaced. Regular rate & rhythm. No rubs, gallops or murmurs. Lungs: clear Abdomen: soft, nontender, nondistended. No hepatosplenomegaly. No bruits or masses. Good bowel sounds. Extremities: no cyanosis, clubbing, rash, edema Neuro: alert & oriented x 3, cranial nerves grossly intact. moves all 4 extremities w/o difficulty. Affect pleasant.  ECG:   ASSESSMENT & PLAN:  NYHA *** GDMT  Diuretic- BB- Ace/ARB/ARNI MRA SGLT2i    Referred to HFSW (PCP, Medications, Transportation, ETOH Abuse, Drug Abuse, Insurance, Financial ): Yes or No Refer to Pharmacy: Yes or No Refer to Home Health: Yes on No Refer to Advanced Heart Failure Clinic: Yes or no  Refer to General Cardiology: Yes or No  Follow up

## 2022-01-11 ENCOUNTER — Ambulatory Visit (HOSPITAL_COMMUNITY)
Admit: 2022-01-11 | Discharge: 2022-01-11 | Disposition: A | Discharge status: Home / Self Care | Payer: Medicare Other | Source: Ambulatory Visit | Attending: Adult Health | Admitting: Adult Health

## 2022-01-11 ENCOUNTER — Other Ambulatory Visit: Payer: Self-pay

## 2022-01-11 ENCOUNTER — Telehealth (HOSPITAL_COMMUNITY): Payer: Self-pay | Admitting: *Deleted

## 2022-01-11 ENCOUNTER — Encounter (HOSPITAL_COMMUNITY): Payer: Medicare Other

## 2022-01-11 ENCOUNTER — Telehealth: Payer: Self-pay | Admitting: Cardiology

## 2022-01-11 VITALS — BP 134/66 | HR 82 | Wt 102.0 lb

## 2022-01-11 DIAGNOSIS — E782 Mixed hyperlipidemia: Secondary | ICD-10-CM | POA: Diagnosis not present

## 2022-01-11 DIAGNOSIS — I2510 Atherosclerotic heart disease of native coronary artery without angina pectoris: Secondary | ICD-10-CM

## 2022-01-11 DIAGNOSIS — I5022 Chronic systolic (congestive) heart failure: Secondary | ICD-10-CM | POA: Diagnosis not present

## 2022-01-11 DIAGNOSIS — I119 Hypertensive heart disease without heart failure: Secondary | ICD-10-CM

## 2022-01-11 DIAGNOSIS — Z8639 Personal history of other endocrine, nutritional and metabolic disease: Secondary | ICD-10-CM | POA: Diagnosis not present

## 2022-01-11 DIAGNOSIS — I110 Hypertensive heart disease with heart failure: Secondary | ICD-10-CM | POA: Insufficient documentation

## 2022-01-11 MED ORDER — FUROSEMIDE 20 MG PO TABS: 20.0000 mg | ORAL_TABLET | ORAL | 3 refills | Status: DC

## 2022-01-11 NOTE — Patient Instructions (Signed)
Decrease Furosemide to 20 mg every other day  ? ?Do the following things EVERYDAY: ?Weigh yourself in the morning before breakfast. Write it down and keep it in a log. ?Take your medicines as prescribed ?Eat low salt foods--Limit salt (sodium) to 2000 mg per day.  ?Stay as active as you can everyday ?Limit all fluids for the day to less than 2 liters ? ?Thank you for allowing [place] to provider your heart failure care after your recent hospitalization. Please follow-up with [facility] (Dr Anne Fu) in [place], they will call you for an appointment ? ?If you have any questions, issues, or concerns before your next appointment please call our office at [phone], opt. 2 and leave a message for the triage nurse. ? ? ?

## 2022-01-11 NOTE — Telephone Encounter (Signed)
Called to confirm Heart & Vascular Transitions of Care appointment at 2pm today. Patient reminded to bring all medications and pill box organizer with them. Confirmed patient has transportation. Gave directions, instructed to utilize valet parking. ? ?Confirmed appointment prior to ending call.  ? ?Rhae Hammock, BSN, RN ?Heart Failure Nurse Navigator ?[phone] ? ?

## 2022-01-11 NOTE — Telephone Encounter (Signed)
Patient requesting to switch from Dr. Tomie China to Dr. Anne Fu due to moving closer to [facility] ?

## 2022-01-11 NOTE — Progress Notes (Signed)
ReDS Vest / Clip - 01/11/22 1400   ? ?  ? ReDS Vest / Clip  ? Station Marker C   ? Ruler Value 20   ? ReDS Value Range Low volume   ? ReDS Actual Value 30   ? ?  ?  ? ?  ? ? ?

## 2022-01-24 ENCOUNTER — Other Ambulatory Visit: Payer: Self-pay | Admitting: Cardiology

## 2022-01-28 ENCOUNTER — Other Ambulatory Visit: Payer: Self-pay

## 2022-01-28 MED ORDER — FUROSEMIDE 20 MG PO TABS: 20.0000 mg | ORAL_TABLET | ORAL | 3 refills | Status: DC

## 2022-02-23 ENCOUNTER — Ambulatory Visit: Payer: Medicare Other | Admitting: Cardiology

## 2022-06-22 ENCOUNTER — Ambulatory Visit: Payer: Medicare Other | Admitting: Cardiology

## 2022-06-28 ENCOUNTER — Encounter: Payer: Self-pay | Admitting: Cardiology

## 2022-06-28 ENCOUNTER — Ambulatory Visit: Payer: Medicare Other | Attending: Cardiology | Admitting: Cardiology

## 2022-06-28 VITALS — BP 120/70 | HR 78 | Ht 67.0 in | Wt 100.0 lb

## 2022-06-28 DIAGNOSIS — I5022 Chronic systolic (congestive) heart failure: Secondary | ICD-10-CM

## 2022-06-28 DIAGNOSIS — I2510 Atherosclerotic heart disease of native coronary artery without angina pectoris: Secondary | ICD-10-CM | POA: Diagnosis not present

## 2022-06-28 DIAGNOSIS — I119 Hypertensive heart disease without heart failure: Secondary | ICD-10-CM

## 2022-06-28 DIAGNOSIS — I351 Nonrheumatic aortic (valve) insufficiency: Secondary | ICD-10-CM | POA: Diagnosis not present

## 2022-06-28 NOTE — Progress Notes (Signed)
Cardiology Office Note:    Date:  06/28/2022   ID:  Kristine Valdez, DOB 11-Aug-1926, MRN [id number]  PCP:  Jerline Pain, MD   [place] Providers Cardiologist:  None     Referring MD: Leonard Downing, *     History of Present Illness:    Kristine Valdez is a 88 y.o. female here for follow-up coronary artery disease, systolic heart failure EF 35%, aortic regurgitation, and hypertension.  I met her originally during hospitalization in March 2023.  Fluid overload.  Diuresed well.  She was also seen by the transition of care clinic heart failure clinic.  Doing very well. Today:   She is accompanied by a family member  She is feeling ok today. She does report some episodes of dizziness. Her daughter reports that the latest episode was Saturday while in the heat and she felt dizzy upon standing. They felt she was not drinking enough water.   She has been having difficulty hearing despite using hearing aids.   At times she is feeling fatigued.   She confirmed that she is currently taking the lasix every other day. Weight has been stable at home.   She has been compliant with the plavix and denies any bleeding issues.   She denies any palpitations, chest pain, shortness of breath, or peripheral edema. No headaches, syncope, orthopnea, or PND.   Hospital records, echocardiogram personally reviewed, lab work reviewed.  Past Medical History:  Diagnosis Date   Acute on chronic systolic congestive heart failure ([place]) 12/01/2011   Aortic regurgitation 09/01/2020   Arteriosclerosis of coronary artery 06/18/2021   Formatting of this note might be different from the original. Formatting of this note might be different from the original. Cardiac cath 12/02/11  Short LM, patent LAD stent with 50% ISR, 95% ostial jailed diagonal stenosis, dominant circumflex with no significant disease, nondominant RCA with calcified ostium                        2007   Anterior MI                          2007  DES to LAD for occ   Bilateral carotid bruits 11/21/2018   CAD (coronary artery disease)    Cardiac cath 12/02/11  Short LM, patent LAD stent with 50% ISR, 95% ostial jailed diagonal stenosis, dominant circumflex with no significant disease, nondominant RCA with calcified ostium                        2007   Anterior MI                         2007  DES to LAD for occluded LAD at site of prior J and J stent    Cataract, nuclear 08/22/2012   CHF (congestive heart failure) ([place])    Chronic systolic congestive heart failure, NYHA class 2 ([place])    Combined form of 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 Added automatically from request for surgery [id number]   Coronary artery disease    Essential hypertension 03/05/2020   Hyperlipidemia    Hypertension    Hypertensive heart disease without CHF    Impacted cerumen of left ear 11/30/2021   Macular retinal puckering, right eye 12/01/2015   Moderate  mitral regurgitation 09/22/2021   Nuclear sclerosis of both eyes 04/08/2018   Old anterior myocardial infarction    Open angle with borderline findings, low risk 08/22/2012   Open angle with borderline findings, low risk, bilateral 12/01/2015   Posterior vitreous detachment, right 04/03/2014    Past Surgical History:  Procedure Laterality Date   ABDOMINAL HYSTERECTOMY     LEFT HEART CATHETERIZATION WITH CORONARY ANGIOGRAM N/A 12/02/2011   Procedure: LEFT HEART CATHETERIZATION WITH CORONARY ANGIOGRAM;  Surgeon: Jacolyn Reedy, MD;  Location: [facility] CATH LAB;  Service: Cardiovascular;  Laterality: N/A;    Current Medications: Current Meds  Medication Sig   acetaminophen (TYLENOL) 325 MG tablet Take 650 mg by mouth every 6 (six) hours as needed for mild pain.   clopidogrel (PLAVIX) 75 MG tablet Take 1 tablet (75 mg total) by mouth daily.   furosemide (LASIX) 20 MG tablet Take 1 tablet (20 mg total) by mouth every other day.    metoprolol succinate (TOPROL-XL) 100 MG 24 hr tablet Take 1 tablet (100 mg total) by mouth daily. Take with or immediately following a meal.   nitroGLYCERIN (NITROSTAT) 0.4 MG SL tablet Place 0.4 mg under the tongue every 5 (five) minutes as needed for chest pain.   VITAMIN D PO Take 1 capsule by mouth once a week. Unknown strength     Allergies:   Cortisone, Penicillins, Statins, Ace inhibitors, Codeine, Hydrocodone-acetaminophen, and Digoxin and related   Social History   Socioeconomic History   Marital status: Widowed    Spouse name: Not on file   Number of children: 23   Years of education: Not on file   Highest education level: Not on file  Occupational History   Occupation: retired  Tobacco Use   Smoking status: Never   Smokeless tobacco: Never  Vaping Use   Vaping Use: Never used  Substance and Sexual Activity   Alcohol use: No   Drug use: No   Sexual activity: Never  Other Topics Concern   Not on file  Social History Narrative   Widow.  Lives alone   Social Determinants of Health   Financial Resource Strain: Low Risk  (12/28/2021)   Overall Financial Resource Strain (CARDIA)    Difficulty of Paying Living Expenses: Not hard at all  Food Insecurity: No Food Insecurity (12/28/2021)   Hunger Vital Sign    Worried About Running Out of Food in the Last Year: Never true    Ran Out of Food in the Last Year: Never true  Transportation Needs: No Transportation Needs (12/28/2021)   PRAPARE - Hydrologist (Medical): No    Lack of Transportation (Non-Medical): No  Physical Activity: Not on file  Stress: Not on file  Social Connections: Not on file     Family History: The patient's family history includes Breast cancer in her sister; Cancer in her brother; Coronary artery disease in her brother; Heart attack in her father; Heart failure in her mother.  ROS:   Please see the history of present illness.    (+) dizziness (+) fatigue  All other  systems reviewed and are negative.  EKGs/Labs/Other Studies Reviewed:    The following studies were reviewed today:  Echo 09/03/2021: IMPRESSIONS    1. Now segmental wall motion abnormalities and diminished EF as compare  to echo from 2020. Left ventricular ejection fraction, by estimation, is  30 to 35%. The left ventricle has moderately decreased function. The left  ventricle demonstrates regional  wall motion abnormalities (see scoring diagram/findings for description).  There is moderate left ventricular hypertrophy. Left ventricular diastolic  parameters are consistent with Grade III diastolic dysfunction  (restrictive). There is severe akinesis of   the left ventricular, mid-apical anterior wall. There is severe akinesis  of the left ventricular, mid-apical anteroseptal wall.   2. Right ventricular systolic function is normal. The right ventricular  size is normal. There is moderately elevated pulmonary artery systolic  pressure.   3. Left atrial size was moderately dilated.   4. The mitral valve is normal in structure. Moderate mitral valve  regurgitation. No evidence of mitral stenosis.   5. The aortic valve is normal in structure. Aortic valve regurgitation is  moderate. No aortic stenosis is present.   6. The inferior vena cava is normal in size with greater than 50%  respiratory variability, suggesting right atrial pressure of 3 mmHg.   Bilateral VAS Carotid Doppler 12/04/2018: Summary:  Right Carotid: Velocities in the right ICA are consistent with a 1-39%  stenosis.   Left Carotid: Velocities in the left ICA are consistent with a 40-59%  stenosis.                .   Vertebrals:  Bilateral vertebral arteries demonstrate antegrade flow.  Subclavians: Normal flow hemodynamics were seen in bilateral subclavian               arteries.   EKG:  EKG is personally reviewed.  06/28/2022: no EKG was ordered today  Recent Labs: 12/28/2021: ALT 86; B Natriuretic Peptide  1,356.0; Hemoglobin 12.7; Platelets 342; TSH 1.443 12/29/2021: BUN 28; Creatinine, Ser 1.19; Magnesium 1.9; Potassium 4.4; Sodium 133  Recent Lipid Panel    Component Value Date/Time   CHOL 250 (H) 08/25/2021 0858   TRIG 108 08/25/2021 0858   HDL 97 08/25/2021 0858   CHOLHDL 2.6 08/25/2021 0858   CHOLHDL 2.6 12/02/2011 0940   VLDL 14 12/02/2011 0940   LDLCALC 135 (H) 08/25/2021 0858     Risk Assessment/Calculations:               Physical Exam:    VS:  BP 120/70 (BP Location: Left Arm, Patient Position: Sitting, Cuff Size: Normal)   Pulse 78   Ht 5' 7" (1.702 m)   Wt 100 lb (45.4 kg)   SpO2 96%   BMI 15.66 kg/m     Wt Readings from Last 3 Encounters:  06/28/22 100 lb (45.4 kg)  01/11/22 102 lb (46.3 kg)  12/29/21 100 lb 11.2 oz (45.7 kg)     GEN:  Thin, well developed in no acute distress HEENT: Normal NECK: No JVD; No carotid bruits LYMPHATICS: No lymphadenopathy CARDIAC: RRR, 6-5/6 systolic murmur with a 1/6 diastolic component, no rubs, no gallops RESPIRATORY:  Clear to auscultation without rales, wheezing or rhonchi  ABDOMEN: Soft, non-tender, non-distended MUSCULOSKELETAL:  No edema; No deformity  SKIN: Warm and dry NEUROLOGIC:  Alert and oriented x 3 PSYCHIATRIC:  Normal affect   ASSESSMENT:    1. Chronic systolic congestive heart failure, NYHA class 2 ([place])   2. Hypertensive heart disease without CHF   3. Coronary artery disease involving native coronary artery of native heart without angina pectoris   4. Aortic valve insufficiency, etiology of cardiac valve disease unspecified    PLAN:    In order of problems listed above:  Chronic systolic heart failure - EF 35% well compensated.  Taking Lasix 20 mg every other day.  No ARB because  of hyperkalemia.  Well compensated.  Continue to be active.  Coronary artery disease - Prior stenting as described above.  Continue with Plavix monotherapy.  No bleeding.  Also on Toprol-XL.  Aortic  stenosis/regurgitation - Mild.  Asymptomatic.  Hypertension - Well-controlled today 120/70.  Hyperkalemia - Avoiding ARB        Follow-up: 6 month with APP, 1 year with me.   Medication Adjustments/Labs and Tests Ordered: Current medicines are reviewed at length with the patient today.  Concerns regarding medicines are outlined above.  No orders of the defined types were placed in this encounter.  No orders of the defined types were placed in this encounter.   Patient Instructions  Medication Instructions:  The current medical regimen is effective;  continue present plan and medications.  *If you need a refill on your cardiac medications before your next appointment, please call your pharmacy*   Follow-Up: At [facility], you and your health needs are our priority.  As part of our continuing mission to provide you with exceptional heart care, we have created designated Provider Care Teams.  These Care Teams include your primary Cardiologist (physician) and Advanced Practice Providers (APPs -  Physician Assistants and Nurse Practitioners) who all work together to provide you with the care you need, when you need it.  We recommend signing up for the patient portal called "MyChart".  Sign up information is provided on this After Visit Summary.  MyChart is used to connect with patients for Virtual Visits (Telemedicine).  Patients are able to view lab/test results, encounter notes, upcoming appointments, etc.  Non-urgent messages can be sent to your provider as well.   To learn more about what you can do with MyChart, go to [email or web address].    Your next appointment:   6 month(s)  The format for your next appointment:   In Person  Provider:   Robbie Lis, PA-C, Nicholes Rough, PA-C, Dayna Dunn, PA-C, Ermalinda Barrios, PA-C, Christen Bame, NP, or Richardson Dopp, PA-C     Then, Dr Candee Furbish, MD will plan to see you again in 1 year(s).    Important Information About  [place]          I,Jessica Ford,acting as a scribe for [employer], MD.,have documented all relevant documentation on the behalf of Candee Furbish, MD,as directed by  Candee Furbish, MD while in the presence of Candee Furbish, MD.   I, Candee Furbish, MD, have reviewed all documentation for this visit. The documentation on 06/28/22 for the exam, diagnosis, procedures, and orders are all accurate and complete.   Signed, Candee Furbish, MD  06/28/2022 11:46 AM    [place]

## 2022-06-28 NOTE — Patient Instructions (Signed)
Medication Instructions:  The current medical regimen is effective;  continue present plan and medications.  *If you need a refill on your cardiac medications before your next appointment, please call your pharmacy*   Follow-Up: At [facility], you and your health needs are our priority.  As part of our continuing mission to provide you with exceptional heart care, we have created designated Provider Care Teams.  These Care Teams include your primary Cardiologist (physician) and Advanced Practice Providers (APPs -  Physician Assistants and Nurse Practitioners) who all work together to provide you with the care you need, when you need it.  We recommend signing up for the patient portal called "MyChart".  Sign up information is provided on this After Visit Summary.  MyChart is used to connect with patients for Virtual Visits (Telemedicine).  Patients are able to view lab/test results, encounter notes, upcoming appointments, etc.  Non-urgent messages can be sent to your provider as well.   To learn more about what you can do with MyChart, go to [email or web address].    Your next appointment:   6 month(s)  The format for your next appointment:   In Person  Provider:   Chelsea Aus, PA-C, Jari Favre, PA-C, Dayna Dunn, PA-C, Jacolyn Reedy, PA-C, Eligha Bridegroom, NP, or Tereso Newcomer, PA-C     Then, Dr Donato Schultz, MD will plan to see you again in 1 year(s).    Important Information About Sugar

## 2022-11-22 ENCOUNTER — Other Ambulatory Visit: Payer: Self-pay | Admitting: *Deleted

## 2022-11-22 ENCOUNTER — Telehealth: Payer: Self-pay | Admitting: Cardiology

## 2022-11-22 MED ORDER — FUROSEMIDE 20 MG PO TABS: 20.0000 mg | ORAL_TABLET | ORAL | 1 refills | Status: DC

## 2022-11-22 MED ORDER — CLOPIDOGREL BISULFATE 75 MG PO TABS: 75.0000 mg | ORAL_TABLET | Freq: Every day | ORAL | 1 refills | Status: DC

## 2022-11-22 MED ORDER — METOPROLOL SUCCINATE ER 100 MG PO TB24: 100.0000 mg | ORAL_TABLET | Freq: Every day | ORAL | 1 refills | Status: DC

## 2022-11-22 NOTE — Telephone Encounter (Signed)
*  STAT* If patient is at the pharmacy, call can be transferred to refill team.   1. Which medications need to be refilled? (please list name of each medication and dose if known) clopidogrel (PLAVIX) 75 MG tablet ; furosemide (LASIX) 20 MG tablet ; metoprolol succinate (TOPROL-XL) 100 MG 24 hr tablet   2. Which pharmacy/location (including street and city if local pharmacy) is medication to be sent to? [place], [place]  3. Do they need a 30 day or 90 day supply? [facility]

## 2022-12-24 NOTE — Progress Notes (Unsigned)
Office Visit    Patient Name: Kristine Valdez Date of Encounter: 12/27/2022  PCP:  Leonard Downing, [place]  Cardiologist:  Candee Furbish, MD  Advanced Practice Provider:  No care team member to display Electrophysiologist:  None   HPI    Kristine Valdez is a 89 y.o. female with a past medical history of coronary artery disease, systolic heart failure EF 35%, aortic regurgitation, and hypertension presents today for follow-up appointment.  We were originally consulted during March 2023 hospitalization.  Fluid overload at that time.  Diuresed well.  She was also seen by the transition of care clinic, heart failure clinic.  She was last seen August 2023 and was doing well at that time.  She reported some episodes of dizziness.  Daughter reported that the latest episode was at past Saturday while in the heat and she felt dizzy upon standing.  They felt that it was due to dehydration.  She is having a hard time hearing despite using hearing aids.  At times, she was feeling fatigued.  She confirmed that she was taking Lasix every other day and weight have been stable at home.  Compliant with Plavix and denies any bleeding issues.  Denied any chest pain or shortness of breath.  No peripheral edema, headaches, syncope, orthopnea, or PND.  Today, she feels pretty good. She states her biggest issue is sciatica. She is working on doing more walking. She has been trying to make healthy diet choices but she is having some dental issues where she needs to eat soft foods. No issues with her heart lately.   Reports no shortness of breath nor dyspnea on exertion. Reports no chest pain, pressure, or tightness. No edema, orthopnea, PND. Reports no palpitations.    Past Medical History    Past Medical History:  Diagnosis Date   Acute on chronic systolic congestive heart failure ([place]) 12/01/2011   Aortic regurgitation 09/01/2020   Arteriosclerosis of coronary artery  06/18/2021   Formatting of this note might be different from the original. Formatting of this note might be different from the original. Cardiac cath 12/02/11  Short LM, patent LAD stent with 50% ISR, 95% ostial jailed diagonal stenosis, dominant circumflex with no significant disease, nondominant RCA with calcified ostium                        2007   Anterior MI                         2007  DES to LAD for occ   Bilateral carotid bruits 11/21/2018   CAD (coronary artery disease)    Cardiac cath 12/02/11  Short LM, patent LAD stent with 50% ISR, 95% ostial jailed diagonal stenosis, dominant circumflex with no significant disease, nondominant RCA with calcified ostium                        2007   Anterior MI                         2007  DES to LAD for occluded LAD at site of prior J and J stent    Cataract, nuclear 08/22/2012   CHF (congestive heart failure) ([place])    Chronic systolic congestive heart failure, NYHA class 2 ([place])    Combined form of age-related cataract, both  eyes 06/16/2021   Formatting of this note might be different from the original. Added automatically from request for surgery [id number]   Coronary artery disease    Essential hypertension 03/05/2020   Hyperlipidemia    Hypertension    Hypertensive heart disease without CHF    Impacted cerumen of left ear 11/30/2021   Macular retinal puckering, right eye 12/01/2015   Moderate mitral regurgitation 09/22/2021   Nuclear sclerosis of both eyes 04/08/2018   Old anterior myocardial infarction    Open angle with borderline findings, low risk 08/22/2012   Open angle with borderline findings, low risk, bilateral 12/01/2015   Posterior vitreous detachment, right 04/03/2014   Past Surgical History:  Procedure Laterality Date   ABDOMINAL HYSTERECTOMY     LEFT HEART CATHETERIZATION WITH CORONARY ANGIOGRAM N/A 12/02/2011   Procedure: LEFT HEART CATHETERIZATION WITH CORONARY ANGIOGRAM;  Surgeon: Jacolyn Reedy, MD;  [facility]: [facility] CATH LAB;  Service:  Cardiovascular;  Laterality: N/A;    Allergies  Allergies  Allergen Reactions   Cortisone Other (See Comments)    Blurred vision   Penicillins Other (See Comments)    Has patient had a PCN reaction causing immediate rash, facial/tongue/throat swelling, SOB or lightheadedness with hypotension: UNK Has patient had a PCN reaction causing severe rash involving mucus membranes or skin necrosis: UNK Has patient had a PCN reaction that required hospitalization: UNK Has patient had a PCN reaction occurring within the last 10 years: No If all of the above answers are "NO", then may proceed with Cephalosporin use.    Statins Other (See Comments)    Elevates blood pressure   Ace Inhibitors Cough   Codeine Nausea Only and Other (See Comments)   Hydrocodone-Acetaminophen Nausea Only and Other (See Comments)   Digoxin And Related     dizzy     EKGs/Labs/Other Studies Reviewed:   The following studies were reviewed today:   Echo 09/03/2021: IMPRESSIONS    1. Now segmental wall motion abnormalities and diminished EF as compare  to echo from 2020. Left ventricular ejection fraction, by estimation, is  30 to 35%. The left ventricle has moderately decreased function. The left  ventricle demonstrates regional  wall motion abnormalities (see scoring diagram/findings for description).  There is moderate left ventricular hypertrophy. Left ventricular diastolic  parameters are consistent with Grade III diastolic dysfunction  (restrictive). There is severe akinesis of   the left ventricular, mid-apical anterior wall. There is severe akinesis  of the left ventricular, mid-apical anteroseptal wall.   2. Right ventricular systolic function is normal. The right ventricular  size is normal. There is moderately elevated pulmonary artery systolic  pressure.   3. Left atrial size was moderately dilated.   4. The mitral valve is normal in structure. Moderate mitral valve  regurgitation. No evidence of  mitral stenosis.   5. The aortic valve is normal in structure. Aortic valve regurgitation is  moderate. No aortic stenosis is present.   6. The inferior vena cava is normal in size with greater than 50%  respiratory variability, suggesting right atrial pressure of 3 mmHg.    Bilateral VAS Carotid Doppler 12/04/2018: Summary:  Right Carotid: Velocities in the right ICA are consistent with a 1-39%  stenosis.   Left Carotid: Velocities in the left ICA are consistent with a 40-59%  stenosis.                .   Vertebrals:  Bilateral vertebral arteries demonstrate antegrade flow.  Subclavians: Normal flow hemodynamics  were seen in bilateral subclavian               arteries.   EKG:  EKG is  ordered today.  The ekg ordered today demonstrates NSR, rate 70 bpm  Recent Labs: 12/28/2021: ALT 86; B Natriuretic Peptide 1,356.0; Hemoglobin 12.7; Platelets 342; TSH 1.443 12/29/2021: BUN 28; Creatinine, Ser 1.19; Magnesium 1.9; Potassium 4.4; Sodium 133  Recent Lipid Panel    Component Value Date/Time   CHOL 250 (H) 08/25/2021 0858   TRIG 108 08/25/2021 0858   HDL 97 08/25/2021 0858   CHOLHDL 2.6 08/25/2021 0858   CHOLHDL 2.6 12/02/2011 0940   VLDL 14 12/02/2011 0940   LDLCALC 135 (H) 08/25/2021 0858     Home Medications   Current Meds  Medication Sig   acetaminophen (TYLENOL) 325 MG tablet Take 650 mg by mouth every 6 (six) hours as needed for mild pain.   clopidogrel (PLAVIX) 75 MG tablet Take 1 tablet (75 mg total) by mouth daily.   furosemide (LASIX) 20 MG tablet Take 1 tablet (20 mg total) by mouth every other day.   Metoprolol Succinate 100 MG CS24 Take 1.5 tablets by mouth daily at 6 (six) AM. Pt takes 150 mg daily   nitroGLYCERIN (NITROSTAT) 0.4 MG SL tablet Place 0.4 mg under the tongue every 5 (five) minutes as needed for chest pain.   VITAMIN D PO Take 1 capsule by mouth daily at 6 (six) AM. Pt takes 3000 units.     Review of Systems      All other systems reviewed and are  otherwise negative except as noted above.  Physical Exam    VS:  BP 134/86   Pulse 70   Ht '5\' 7"'$  (1.702 m)   Wt 104 lb 12.8 oz (47.5 kg)   SpO2 98%   BMI 16.41 kg/m  , BMI Body mass index is 16.41 kg/m.  Wt Readings from Last 3 Encounters:  12/27/22 104 lb 12.8 oz (47.5 kg)  06/28/22 100 lb (45.4 kg)  01/11/22 102 lb (46.3 kg)     GEN: Well nourished, well developed, in no acute distress. HEENT: normal. Neck: Supple, no JVD, carotid bruits, or masses. Cardiac: RRR, no murmurs, rubs, or gallops. No clubbing, cyanosis, edema.  Radials/PT 2+ and equal bilaterally.  Respiratory:  Respirations regular and unlabored, clear to auscultation bilaterally. GI: Soft, nontender, nondistended. MS: No deformity or atrophy. Skin: Warm and dry, no rash. Neuro:  Strength and sensation are intact. Psych: Normal affect.  Assessment & Plan    Chronic systolic heart failure -LVEF 35%, ,well compensated -euvolemic today -she remains on lasix '20mg'$  every other day -continue metoprolol '150mg'$  daily  CAD -prior stenting, continue plavix monotherapy -no chest pain or SOB  Aortic stenosis/regurgitation -mild, asymptomatic  Hypertension -well controlled today 134/86 -continue current medications  Hyperkalemia -resolved, last potassium 4.4  6. Hyperlipidemia -recheck a lipid panel         Disposition: Follow up 1 year with Candee Furbish, MD or APP.  Signed, Elgie Collard, PA-C 12/27/2022, 12:08 PM [place]

## 2022-12-27 ENCOUNTER — Encounter: Payer: Self-pay | Admitting: Physician Assistant

## 2022-12-27 ENCOUNTER — Ambulatory Visit: Payer: Medicare Other | Attending: Physician Assistant | Admitting: Physician Assistant

## 2022-12-27 VITALS — BP 134/86 | HR 70 | Ht 67.0 in | Wt 104.8 lb

## 2022-12-27 DIAGNOSIS — I10 Essential (primary) hypertension: Secondary | ICD-10-CM

## 2022-12-27 DIAGNOSIS — E785 Hyperlipidemia, unspecified: Secondary | ICD-10-CM | POA: Diagnosis not present

## 2022-12-27 DIAGNOSIS — I5022 Chronic systolic (congestive) heart failure: Secondary | ICD-10-CM | POA: Diagnosis not present

## 2022-12-27 DIAGNOSIS — I25118 Atherosclerotic heart disease of native coronary artery with other forms of angina pectoris: Secondary | ICD-10-CM

## 2022-12-27 DIAGNOSIS — I351 Nonrheumatic aortic (valve) insufficiency: Secondary | ICD-10-CM

## 2022-12-27 DIAGNOSIS — I350 Nonrheumatic aortic (valve) stenosis: Secondary | ICD-10-CM

## 2022-12-27 NOTE — Patient Instructions (Signed)
Medication Instructions:  Your physician recommends that you continue on your current medications as directed. Please refer to the Current Medication list given to you today.  *If you need a refill on your cardiac medications before your next appointment, please call your pharmacy*  Lab Work: Fasting lipid panel on Tuesday 01/03/23, you can come anytime between 7:15 AM-4:30 PM If you have labs (blood work) drawn today and your tests are completely normal, you will receive your results only by: [place] (if you have MyChart) OR A paper copy in the mail If you have any lab test that is abnormal or we need to change your treatment, we will call you to review the results.  Follow-Up: At [facility], you and your health needs are our priority.  As part of our continuing mission to provide you with exceptional heart care, we have created designated Provider Care Teams.  These Care Teams include your primary Cardiologist (physician) and Advanced Practice Providers (APPs -  Physician Assistants and Nurse Practitioners) who all work together to provide you with the care you need, when you need it.  Your next appointment:   1 year(s)  Provider:   Candee Furbish, MD

## 2023-01-03 ENCOUNTER — Ambulatory Visit: Payer: Medicare Other | Attending: Physician Assistant

## 2023-01-03 DIAGNOSIS — I350 Nonrheumatic aortic (valve) stenosis: Secondary | ICD-10-CM

## 2023-01-03 DIAGNOSIS — I5022 Chronic systolic (congestive) heart failure: Secondary | ICD-10-CM

## 2023-01-03 DIAGNOSIS — I10 Essential (primary) hypertension: Secondary | ICD-10-CM

## 2023-01-03 DIAGNOSIS — E785 Hyperlipidemia, unspecified: Secondary | ICD-10-CM

## 2023-01-03 DIAGNOSIS — I25118 Atherosclerotic heart disease of native coronary artery with other forms of angina pectoris: Secondary | ICD-10-CM

## 2023-01-03 DIAGNOSIS — I351 Nonrheumatic aortic (valve) insufficiency: Secondary | ICD-10-CM

## 2023-01-04 LAB — LIPID PANEL
Chol/HDL Ratio: 2.5 ratio (ref 0.0–4.4)
Cholesterol, Total: 231 mg/dL — ABNORMAL HIGH (ref 100–199)
HDL: 93 mg/dL (ref >39)
LDL Chol Calc (NIH): 126 mg/dL — ABNORMAL HIGH (ref 0–99)
Triglycerides: 69 mg/dL (ref 0–149)
VLDL Cholesterol Cal: 12 mg/dL (ref 5–40)

## 2023-04-20 ENCOUNTER — Other Ambulatory Visit: Payer: Self-pay | Admitting: Cardiology

## 2023-04-27 ENCOUNTER — Other Ambulatory Visit: Payer: Self-pay

## 2023-04-27 ENCOUNTER — Emergency Department (HOSPITAL_BASED_OUTPATIENT_CLINIC_OR_DEPARTMENT_OTHER)
Admission: EM | Admit: 2023-04-27 | Discharge: 2023-04-27 | Disposition: A | Discharge status: Home / Self Care | Payer: Medicare Other | Attending: Emergency Medicine | Admitting: Emergency Medicine

## 2023-04-27 ENCOUNTER — Emergency Department (HOSPITAL_BASED_OUTPATIENT_CLINIC_OR_DEPARTMENT_OTHER): Payer: Medicare Other | Admitting: Radiology

## 2023-04-27 ENCOUNTER — Encounter (HOSPITAL_BASED_OUTPATIENT_CLINIC_OR_DEPARTMENT_OTHER): Payer: Self-pay

## 2023-04-27 DIAGNOSIS — M545 Low back pain, unspecified: Secondary | ICD-10-CM | POA: Insufficient documentation

## 2023-04-27 DIAGNOSIS — X501XXA Overexertion from prolonged static or awkward postures, initial encounter: Secondary | ICD-10-CM | POA: Insufficient documentation

## 2023-04-27 DIAGNOSIS — M25552 Pain in left hip: Secondary | ICD-10-CM | POA: Insufficient documentation

## 2023-04-27 DIAGNOSIS — M25551 Pain in right hip: Secondary | ICD-10-CM | POA: Diagnosis present

## 2023-04-27 DIAGNOSIS — R102 Pelvic and perineal pain: Secondary | ICD-10-CM | POA: Insufficient documentation

## 2023-04-27 LAB — URINALYSIS, ROUTINE W REFLEX MICROSCOPIC
Bacteria, UA: NONE SEEN
Bilirubin Urine: NEGATIVE
Glucose, UA: NEGATIVE mg/dL
Ketones, ur: NEGATIVE mg/dL
Leukocytes,Ua: NEGATIVE
Nitrite: NEGATIVE
Protein, ur: NEGATIVE mg/dL
Specific Gravity, Urine: 1.009 (ref 1.005–1.030)
pH: 7 (ref 5.0–8.0)

## 2023-04-27 MED ORDER — TRAMADOL HCL 50 MG PO TABS
50.0000 mg | ORAL_TABLET | Freq: Once | ORAL | Status: AC
  Administered 2023-04-27: 50 mg via ORAL
  Filled 2023-04-27: qty 1

## 2023-04-27 MED ORDER — OXYCODONE-ACETAMINOPHEN 5-325 MG PO TABS
1.0000 | ORAL_TABLET | Freq: Once | ORAL | Status: DC
  Filled 2023-04-27: qty 1

## 2023-04-27 MED ORDER — ONDANSETRON 4 MG PO TBDP: 4.0000 mg | ORAL_TABLET | Freq: Three times a day (TID) | ORAL | 0 refills | Status: DC | PRN

## 2023-04-27 MED ORDER — TRAMADOL HCL 50 MG PO TABS: 50.0000 mg | ORAL_TABLET | Freq: Four times a day (QID) | ORAL | 0 refills | Status: DC | PRN

## 2023-04-27 MED ORDER — ONDANSETRON 4 MG PO TBDP
8.0000 mg | ORAL_TABLET | Freq: Once | ORAL | Status: AC
  Administered 2023-04-27: 8 mg via ORAL
  Filled 2023-04-27: qty 2

## 2023-04-27 NOTE — ED Triage Notes (Signed)
POV from home, A&O x 4, GCS 15, amb at baseline, BIB wheelchair, sts due to pain  C/o lower back and bilateral hip pain since yesterday. Sts that normally she does not use aids at home to get around but unable to walk now due to pain. Normally has arthritis pain per pt.

## 2023-04-27 NOTE — Discharge Instructions (Signed)
You are seen in the emergency department for worsening hip pain on both sides.  You had x-rays of your back and hips that did not show any fracture.  We are prescribing some tramadol to see if will help with your pain.  Please use caution as this may make you nauseous and constipated.  Contact your primary care doctor for close follow-up.  Return to the emergency department if any worsening or concerning symptoms.  Please discuss with your primary care doctor if physical therapy would be helpful.

## 2023-04-27 NOTE — ED Provider Notes (Signed)
[place] EMERGENCY DEPARTMENT AT [facility] Provider Note   CSN: [id number] Arrival date & time: 04/27/23  1836     History  Chief Complaint  Patient presents with   Back Pain    Kristine Valdez is a 89 y.o. female.  She is brought in by members for pain in her hips and pelvis that is been going on for the last few days.  She has had this chronically for over a year but it seems to be acutely worse over the last few days.  No trauma no falls no fever no urinary symptoms.  She said the pain sometimes will radiate down her thighs.  It does not radiate to her feet and not associate with any numbness or weakness.  She has been taking Tylenol for it without improvement.  She denies any abdominal pain chest pain shortness of breath.  No fevers or chills.   Back Pain Location:  Lumbar spine and gluteal region Quality:  Aching Radiates to:  L thigh and R thigh Pain severity:  Severe Pain is:  Same all the time Onset quality:  Gradual Duration:  2 days Progression:  Unchanged Chronicity:  Chronic Context: not recent injury   Relieved by:  Nothing Worsened by:  Bending Ineffective treatments:  OTC medications Associated symptoms: no abdominal pain, no chest pain, no dysuria, no fever, no numbness and no weakness        Home Medications Prior to Admission medications   Medication Sig Start Date End Date Taking? Authorizing Provider  acetaminophen (TYLENOL) 325 MG tablet Take 650 mg by mouth every 6 (six) hours as needed for mild pain.    [provider]  clopidogrel (PLAVIX) 75 MG tablet Take 1 tablet (75 mg total) by mouth daily. 11/22/22   Jake Bathe, MD  furosemide (LASIX) 20 MG tablet TAKE 1 TABLET BY MOUTH EVERY OTHER DAY 04/21/23   Jake Bathe, MD  Metoprolol Succinate 100 MG CS24 Take 1.5 tablets by mouth daily at 6 (six) AM. Pt takes 150 mg daily    [provider]  nitroGLYCERIN (NITROSTAT) 0.4 MG SL tablet Place 0.4 mg under the tongue  every 5 (five) minutes as needed for chest pain.    [provider]  VITAMIN D PO Take 1 capsule by mouth daily at 6 (six) AM. Pt takes 3000 units.    [provider]      Allergies    Cortisone, Penicillins, Statins, Ace inhibitors, Codeine, Hydrocodone-acetaminophen, and Digoxin and related    Review of Systems   Review of Systems  Constitutional:  Negative for fever.  Cardiovascular:  Negative for chest pain.  Gastrointestinal:  Negative for abdominal pain.  Genitourinary:  Negative for dysuria.  Musculoskeletal:  Positive for back pain.  Neurological:  Negative for weakness and numbness.    Physical Exam Updated Vital Signs BP (!) 186/99 (BP Location: Right Arm)   Pulse 90   Temp 98.1 F (36.7 C)   Resp 18   Ht 5\' 7"  (1.702 m)   Wt 47.5 kg   SpO2 98%   BMI 16.40 kg/m  Physical Exam Vitals and nursing note reviewed.  Constitutional:      General: She is not in acute distress.    Appearance: Normal appearance. She is well-developed.  HENT:     Head: Normocephalic and atraumatic.  Eyes:     Conjunctiva/sclera: Conjunctivae normal.  Cardiovascular:     Rate and Rhythm: Normal rate and regular rhythm.  Heart sounds: No murmur heard. Pulmonary:     Effort: Pulmonary effort is normal. No respiratory distress.     Breath sounds: Normal breath sounds.  Abdominal:     Palpations: Abdomen is soft.     Tenderness: There is no abdominal tenderness.  Musculoskeletal:        General: No deformity. Normal range of motion.     Cervical back: Neck supple.     Comments: She has no lumbar tenderness.  She is tender around her hips bilaterally.  There is full range of motion without any shortening or limitation.  Knees ankles nontender.  Distal pulses motor and sensory intact.  Skin:    General: Skin is warm and dry.     Capillary Refill: Capillary refill takes less than 2 seconds.  Neurological:     General: No focal deficit present.     Mental Status: She  is alert.     Sensory: No sensory deficit.     Motor: No weakness.     ED Results / Procedures / Treatments   Labs (all labs ordered are listed, but only abnormal results are displayed) Labs Reviewed  URINALYSIS, ROUTINE W REFLEX MICROSCOPIC - Abnormal; Notable for the following components:      Result Value   Color, Urine COLORLESS (*)    Hgb urine dipstick TRACE (*)    All other components within normal limits    EKG None  Radiology DG Hips Bilat W or Wo Pelvis 3-4 Views  Result Date: 04/27/2023 CLINICAL DATA:  Low back pain.  Bilateral hip pain since yesterday. EXAM: DG HIP (WITH OR WITHOUT PELVIS) 3-4V BILAT COMPARISON:  None Available. FINDINGS: There is no evidence of hip fracture or dislocation. Bilateral superolateral hip joint space narrowing suggesting mild osteoarthritis. IMPRESSION: 1. No acute fracture or dislocation. 2. Mild bilateral hip osteoarthritis. Electronically Signed   By: Larose Hires D.O.   On: 04/27/2023 20:49   DG Lumbar Spine Complete  Result Date: 04/27/2023 CLINICAL DATA:  Low back pain. EXAM: LUMBAR SPINE - COMPLETE 4+ VIEW COMPARISON:  None Available. FINDINGS: Five lumbar type vertebra. There is no acute fracture or subluxation of the lumbar spine. The bones are osteopenic. Multilevel degenerative changes and facet arthropathy. Grade 1 L4-L5 anterolisthesis. Advanced atherosclerotic calcification of the abdominal aorta. The soft tissues are unremarkable. IMPRESSION: 1. No acute/traumatic lumbar spine pathology. 2. Degenerative changes. Electronically Signed   By: Elgie Collard M.D.   On: 04/27/2023 20:48    Procedures Procedures    Medications Ordered in ED Medications  ondansetron (ZOFRAN-ODT) disintegrating tablet 8 mg (8 mg Oral Given 04/27/23 2146)  traMADol (ULTRAM) tablet 50 mg (50 mg Oral Given 04/27/23 2144)    ED Course/ Medical Decision Making/ A&P Clinical Course as of 04/28/23 0916  Thu Apr 27, 2023  2056 X-rays of lumbar spine  hip and pelvis show degenerative changes no acute fracture. [MB]    Clinical Course User Index [MB] Terrilee Files, MD                             Medical Decision Making Amount and/or Complexity of Data Reviewed Labs: ordered. Radiology: ordered.  Risk Prescription drug management.   This patient complains of pain in her lower back and hips; this involves an extensive number of treatment Options and is a complaint that carries with it a high risk of complications and morbidity. The differential includes arthritis, fracture, contusion, radiculopathy,  UTI, spinal stenosis  I ordered, reviewed and interpreted labs, which included urinalysis without clear signs of infection I ordered medication tramadol and Zofran with improvement in symptoms and reviewed PMP when indicated. I ordered imaging studies which included x-rays of lumbar spine pelvis and bilateral hips and I independently    visualized and interpreted imaging which showed degenerative changes no fracture or dislocation Additional history obtained from patient's family members Previous records obtained and reviewed in epic including recent cardiology and PCP notes Cardiac monitoring reviewed, sinus rhythm Social determinants considered, no significant barriers Critical Interventions: None  After the interventions stated above, I reevaluated the patient and found patient to be well-appearing neurologically intact. Admission and further testing considered, no indications for admission or further workup at this time.  Will trial her on some tramadol and recommended with close follow-up with PCP.  Return instructions discussed.         Final Clinical Impression(s) / ED Diagnoses Final diagnoses:  Bilateral hip pain    Rx / DC Orders ED Discharge Orders          Ordered    traMADol (ULTRAM) 50 MG tablet  Every 6 hours PRN        04/27/23 2152    ondansetron (ZOFRAN-ODT) 4 MG disintegrating tablet  Every 8 hours  PRN        04/27/23 2300              Terrilee Files, MD 04/28/23 [id number]

## 2023-05-03 ENCOUNTER — Emergency Department (HOSPITAL_COMMUNITY)
Admission: EM | Admit: 2023-05-03 | Discharge: 2023-05-03 | Disposition: A | Discharge status: Home / Self Care | Payer: Medicare Other | Attending: Emergency Medicine | Admitting: Emergency Medicine

## 2023-05-03 ENCOUNTER — Emergency Department (HOSPITAL_COMMUNITY): Payer: Medicare Other

## 2023-05-03 ENCOUNTER — Other Ambulatory Visit: Payer: Self-pay

## 2023-05-03 ENCOUNTER — Encounter (HOSPITAL_COMMUNITY): Payer: Self-pay

## 2023-05-03 DIAGNOSIS — Z79899 Other long term (current) drug therapy: Secondary | ICD-10-CM | POA: Insufficient documentation

## 2023-05-03 DIAGNOSIS — Z7902 Long term (current) use of antithrombotics/antiplatelets: Secondary | ICD-10-CM | POA: Insufficient documentation

## 2023-05-03 DIAGNOSIS — I2510 Atherosclerotic heart disease of native coronary artery without angina pectoris: Secondary | ICD-10-CM | POA: Diagnosis not present

## 2023-05-03 DIAGNOSIS — K5903 Drug induced constipation: Secondary | ICD-10-CM | POA: Diagnosis not present

## 2023-05-03 DIAGNOSIS — I509 Heart failure, unspecified: Secondary | ICD-10-CM | POA: Insufficient documentation

## 2023-05-03 DIAGNOSIS — R109 Unspecified abdominal pain: Secondary | ICD-10-CM | POA: Diagnosis present

## 2023-05-03 DIAGNOSIS — I110 Hypertensive heart disease with heart failure: Secondary | ICD-10-CM | POA: Insufficient documentation

## 2023-05-03 NOTE — ED Provider Notes (Signed)
[name] EMERGENCY DEPARTMENT AT [facility] Provider Note   CSN: [id number] Arrival date & time: 05/03/23  1503     History Chief Complaint  Patient presents with   Constipation    Kristine Valdez is a 89 y.o. female with history of CAD, CHF, hypertension presents emerged from today for evaluation of constipation.  Patient was recently given tramadol for her hip pain.  She tried a few days of it but daughter at bedside reports that she became confused and was hallucinating medication.  When he stopped the medication her hallucinations and altered mental status not back to baseline.  She did become constipated after the medication however.  Last bowel movement was a few days ago.  Daughter reports they tried giving her some prunes last night without any production of a bowel movement.  They did give her Dulcolax shortly prior to arrival and decided to come to the emergency department due to her constipation and pain.  Shortly upon arrival to the emergency department, she had a very large bowel movement which she describes as normal in coloration.  She reports that her abdominal pain has subsided and that she is feeling much better.  She did not have any nausea or vomiting.  She is still able to pass gas.  No fevers.  No rectal pain.  No melena or hematochezia.  Nursing notes mentions that she was ANO x 2 however patient did not have her hearing aids in and possibly could not hear her given her abdominal pain.  Now that she has had a bowel movement, or with speaking into her ear, she is A&Ox4. Daughter reports that she is at her baseline.    Constipation Associated symptoms: abdominal pain   Associated symptoms: no dysuria, no fever, no nausea and no vomiting        Home Medications Prior to Admission medications   Medication Sig Start Date End Date Taking? Authorizing Provider  acetaminophen (TYLENOL) 325 MG tablet Take 650 mg by mouth every 6 (six) hours as needed for mild  pain.    [provider]  clopidogrel (PLAVIX) 75 MG tablet Take 1 tablet (75 mg total) by mouth daily. 11/22/22   Jake Bathe, MD  furosemide (LASIX) 20 MG tablet TAKE 1 TABLET BY MOUTH EVERY OTHER DAY 04/21/23   Jake Bathe, MD  Metoprolol Succinate 100 MG CS24 Take 1.5 tablets by mouth daily at 6 (six) AM. Pt takes 150 mg daily    [provider]  nitroGLYCERIN (NITROSTAT) 0.4 MG SL tablet Place 0.4 mg under the tongue every 5 (five) minutes as needed for chest pain.    [provider]  ondansetron (ZOFRAN-ODT) 4 MG disintegrating tablet Take 1 tablet (4 mg total) by mouth every 8 (eight) hours as needed for nausea or vomiting. 04/27/23   Terrilee Files, MD  traMADol (ULTRAM) 50 MG tablet Take 1 tablet (50 mg total) by mouth every 6 (six) hours as needed. 04/27/23   Terrilee Files, MD  VITAMIN D PO Take 1 capsule by mouth daily at 6 (six) AM. Pt takes 3000 units.    [provider]      Allergies    Cortisone, Penicillins, Statins, Ace inhibitors, Codeine, Hydrocodone-acetaminophen, and Digoxin and related    Review of Systems   Review of Systems  Constitutional:  Negative for chills and fever.  Respiratory:  Negative for shortness of breath.   Cardiovascular:  Negative for chest pain.  Gastrointestinal:  Positive for abdominal pain and constipation. Negative for nausea, rectal pain and vomiting.  Genitourinary:  Negative for dysuria and hematuria.  Psychiatric/Behavioral:  Negative for confusion.     Physical Exam Updated Vital Signs BP (!) 178/88   Pulse 88   Temp 98.4 F (36.9 C)   Resp 16   Ht 5\' 7"  (1.702 m)   Wt 47.5 kg   SpO2 98%   BMI 16.40 kg/m  Physical Exam Vitals and nursing note reviewed.  Constitutional:      General: She is not in acute distress.    Appearance: She is not ill-appearing or toxic-appearing.  HENT:     Mouth/Throat:     Mouth: Mucous membranes are moist.  Pulmonary:     Effort: Pulmonary effort is  normal. No respiratory distress.  Abdominal:     General: Abdomen is flat. Bowel sounds are normal. There is no distension.     Palpations: Abdomen is soft.     Tenderness: There is no abdominal tenderness. There is no guarding or rebound.     Comments: No abdominal tenderness to deep or soft palpation.  No overlying skin changes noted.  Normal active bowel sounds.  Skin:    General: Skin is warm and dry.  Neurological:     Mental Status: She is alert.     ED Results / Procedures / Treatments   Labs (all labs ordered are listed, but only abnormal results are displayed) Labs Reviewed  URINE CULTURE  URINALYSIS, ROUTINE W REFLEX MICROSCOPIC    EKG None  Radiology DG Abdomen 1 View  Result Date: 05/03/2023 CLINICAL DATA:  Constipation with diffuse abdominal pain EXAM: ABDOMEN - 1 VIEW COMPARISON:  None Available. FINDINGS: Nonobstructive bowel gas pattern. No free air or pneumatosis. Moderate volume stool within the colon. No abnormal radio-opaque calculi or mass effect. No acute or substantial osseous abnormality. The sacrum and coccyx are partially obscured by overlying bowel contents. Vascular calcifications. IMPRESSION: Nonobstructive bowel gas pattern. Moderate volume stool within the colon. Electronically Signed   By: Agustin Cree M.D.   On: 05/03/2023 16:33    Procedures Procedures   Medications Ordered in ED Medications - No data to display  ED Course/ Medical Decision Making/ A&P                           Medical Decision Making Amount and/or Complexity of Data Reviewed Labs: ordered. Radiology: ordered.   89 y.o. female presents to the ER today for evaluation of constipation. Differential diagnosis includes but is not limited to constipation, small bowel obstruction, stercoral colitis. Vital signs show elevated blood pressure otherwise afebrile, pulse rate, satting well room air without increased work of breathing. Physical exam as noted above.  Shortly after arrival  to the emergency department, the patient had a very large bowel movement per patient, family, and nursing.  Report it was brown in coloration.  Patient had significant relief of her symptoms afterwards.  She reports that she feels back to her baseline.  Will obtain x-ray imaging to see if there is more stool in the rectal vault.  XR imaging shows nonobstructive bowel gas pattern. Moderate volume stool within the colon per radiology read.   Urine was unable to be obtained due to contamination with stool.   Since the x-ray, patient has had multiple large bowel movements and still does not complain of any abdominal pain.  No melena or hematochezia.  She does have some  elevated blood pressure but looks like she had elevated blood pressure with her last visit as well.  She reports that she is been compliant with her medications.  I did recommend to her daughter to follow-up with her primary care doctor for further evaluation of this.  Will recommend MiraLAX daily to help with the constipation.  She denies any hip or leg pain now.  At this time, I do not think the patient needs any CT imaging as she has had multiple bowel movements and is feeling better.  She is able to pass gas.  She does not have any obstructive symptoms such as nausea or vomiting.  No fever.  I will lower excision for any sterile colitis that she does not have any rectal pain or tenesmus.  Return precautions listed in the discharge paperwork.   Portions of this report may have been transcribed using voice recognition software. Every effort was made to ensure accuracy; however, inadvertent computerized transcription errors may be present.   I discussed this case with my attending physician who cosigned this note including patient's presenting symptoms, physical exam, and planned diagnostics and interventions. Attending physician stated agreement with plan or made changes to plan which were implemented.   Final Clinical Impression(s) / ED  Diagnoses Final diagnoses:  Drug-induced constipation    Rx / DC Orders ED Discharge Orders     None         Achille Rich, PA-C 05/03/23 1946    Franne Forts, DO 05/07/23 0820

## 2023-05-03 NOTE — ED Notes (Signed)
Attempted to get urine sample. Urine mixed with stool. Unable to take sample.

## 2023-05-03 NOTE — ED Notes (Signed)
Attempted to get urine sample, however it gets mixed in with stool, unable to get urine sample at this time.

## 2023-05-03 NOTE — ED Notes (Signed)
Patient had x-large BM.

## 2023-05-03 NOTE — ED Notes (Signed)
Patient is A&Ox4, denies pain, denies weakness. Able to pivot to commode with minimal assist.

## 2023-05-03 NOTE — ED Triage Notes (Signed)
Pt arrived with daughter. Daughter reports mother was placed on Tramadol for pain from arthritis 1 week ago. Now patient is constipated. Has not had a normal bowel movement in a few days. Patient endorses "pain all over". Hypertensive in triage. Takes BP medications and reports taking them today but has been in pain all day. AAOX2 in triage to name and situation. Daughter states she is normally AAOx4. Denies Any other symptoms at this time.

## 2023-05-03 NOTE — Discharge Instructions (Addendum)
Contact a health care provider if: ?You have pain that gets worse. ?You have a fever. ?You do not have a bowel movement after 4 days. ?You vomit. ?You are not hungry or you lose weight. ?You are bleeding from the opening between the buttocks (anus). ?You have thin, pencil-like stools. ?Get help right away if: ?You have a fever and your symptoms suddenly get worse. ?You leak stool or have blood in your stool. ?Your abdomen is bloated. ?You have severe pain in your abdomen. ?You feel dizzy or you faint. ?

## 2023-07-12 ENCOUNTER — Other Ambulatory Visit: Payer: Self-pay | Admitting: Cardiology

## 2023-08-28 ENCOUNTER — Encounter: Payer: Self-pay | Admitting: Cardiology

## 2023-09-06 ENCOUNTER — Ambulatory Visit: Payer: Medicare Other | Attending: Physician Assistant | Admitting: Physician Assistant

## 2023-09-06 NOTE — Progress Notes (Deleted)
Office Visit    Patient Name: Kristine Valdez Date of Encounter: 09/06/2023  PCP:  Kaleen Mask, MD   [facility]  Cardiologist:  Donato Schultz, MD  Advanced Practice Provider:  No care team member to display Electrophysiologist:  None   HPI    Kristine Valdez is a 89 y.o. female with a past medical history of coronary artery disease, systolic heart failure EF 35%, aortic regurgitation, and hypertension presents today for follow-up appointment.  We were originally consulted during March 2023 hospitalization.  Fluid overload at that time.  Diuresed well.  She was also seen by the transition of care clinic, heart failure clinic.  She was last seen August 2023 and was doing well at that time.  She reported some episodes of dizziness.  Daughter reported that the latest episode was at past Saturday while in the heat and she felt dizzy upon standing.  They felt that it was due to dehydration.  She is having a hard time hearing despite using hearing aids.  At times, she was feeling fatigued.  She confirmed that she was taking Lasix every other day and weight have been stable at home.  Compliant with Plavix and denies any bleeding issues.  Denied any chest pain or shortness of breath.  No peripheral edema, headaches, syncope, orthopnea, or PND.  I saw her 12/2022 she feels pretty good. She states her biggest issue is sciatica. She is working on doing more walking. She has been trying to make healthy diet choices but she is having some dental issues where she needs to eat soft foods. No issues with her heart lately.   Today, she ***  Past Medical History    Past Medical History:  Diagnosis Date   Acute on chronic systolic congestive heart failure (HCC) 12/01/2011   Aortic regurgitation 09/01/2020   Arteriosclerosis of coronary artery 06/18/2021   Formatting of this note might be different from the original. Formatting of this note might be different from the  original. Cardiac cath 12/02/11  Short LM, patent LAD stent with 50% ISR, 95% ostial jailed diagonal stenosis, dominant circumflex with no significant disease, nondominant RCA with calcified ostium                        2007   Anterior MI                         2007  DES to LAD for occ   Bilateral carotid bruits 11/21/2018   CAD (coronary artery disease)    Cardiac cath 12/02/11  Short LM, patent LAD stent with 50% ISR, 95% ostial jailed diagonal stenosis, dominant circumflex with no significant disease, nondominant RCA with calcified ostium                        2007   Anterior MI                         2007  DES to LAD for occluded LAD at site of prior J and J stent    Cataract, nuclear 08/22/2012   CHF (congestive heart failure) (HCC)    Chronic systolic congestive heart failure, NYHA class 2 (HCC)    Combined form of 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 infarction    Open angle with borderline findings, low risk 08/22/2012   Open angle with borderline findings, low risk, bilateral 12/01/2015   Posterior vitreous detachment, right 04/03/2014   Past Surgical History:  Procedure Laterality Date   ABDOMINAL HYSTERECTOMY     LEFT HEART CATHETERIZATION WITH CORONARY ANGIOGRAM N/A 12/02/2011   Procedure: LEFT HEART CATHETERIZATION WITH CORONARY ANGIOGRAM;  Surgeon: Othella Boyer, MD;  Location: [facility] CATH LAB;  Service: Cardiovascular;  Laterality: N/A;    Allergies  Allergies  Allergen Reactions   Cortisone Other (See Comments)    Blurred  vision   Penicillins Other (See Comments)    Has patient had a PCN reaction causing immediate rash, facial/tongue/throat swelling, SOB or lightheadedness with hypotension: UNK Has patient had a PCN reaction causing severe rash involving mucus membranes or skin necrosis: UNK Has patient had a PCN reaction that required hospitalization: UNK Has patient had a PCN reaction occurring within the last 10 years: No If all of the above answers are "NO", then may proceed with Cephalosporin use.    Statins Other (See Comments)    Elevates blood pressure   Ace Inhibitors Cough   Codeine Nausea Only and Other (See Comments)   Hydrocodone-Acetaminophen Nausea Only and Other (See Comments)   Digoxin And Related     dizzy     EKGs/Labs/Other Studies Reviewed:   The following studies were reviewed today:   Echo 09/03/2021: IMPRESSIONS    1. Now segmental wall motion abnormalities and diminished EF as compare  to echo from 2020. Left ventricular ejection fraction, by estimation, is  30 to 35%. The left ventricle has moderately decreased function. The left  ventricle demonstrates regional  wall motion abnormalities (see scoring diagram/findings for description).  There is moderate left ventricular hypertrophy. Left ventricular diastolic  parameters are consistent with Grade III diastolic dysfunction  (restrictive). There is severe akinesis of   the left ventricular, mid-apical anterior wall. There is severe akinesis  of the left ventricular, mid-apical anteroseptal wall.   2. Right ventricular systolic function is normal. The right ventricular  size is normal. There is moderately elevated pulmonary artery systolic  pressure.   3. Left atrial size was moderately dilated.   4. The mitral valve is normal in structure. Moderate mitral valve  regurgitation. No evidence of mitral stenosis.   5. The aortic valve is normal in structure. Aortic valve regurgitation is  moderate. No aortic stenosis is  present.   6. The inferior vena cava is normal in size with greater than 50%  respiratory variability, suggesting right atrial pressure of 3 mmHg.    Bilateral VAS Carotid Doppler 12/04/2018: Summary:  Right Carotid: Velocities in the right ICA are consistent with a 1-39%  stenosis.   Left Carotid: Velocities in the left ICA are consistent with a 40-59%  stenosis.                .   Vertebrals:  Bilateral vertebral arteries demonstrate antegrade flow.  Subclavians: Normal flow hemodynamics were seen in bilateral subclavian  arteries.   EKG:  EKG is  ordered today.  The ekg ordered today demonstrates NSR, rate 70 bpm  Recent Labs: No results found for requested labs within last 365 days.  Recent Lipid Panel    Component Value Date/Time   CHOL 231 (H) 01/03/2023 0940   TRIG 69 01/03/2023 0940   HDL 93 01/03/2023 0940   CHOLHDL 2.5 01/03/2023 0940   CHOLHDL 2.6 12/02/2011 0940   VLDL 14 12/02/2011 0940   LDLCALC 126 (H) 01/03/2023 0940     Home Medications   No outpatient medications have been marked as taking for the 09/06/23 encounter (Appointment) with Sharlene Dory, PA-C.     Review of Systems      All other systems reviewed and are otherwise negative except as noted above.  Physical Exam    VS:  There were no vitals taken for this visit. , BMI There is no height or weight on file to calculate BMI.  Wt Readings from Last 3 Encounters:  05/03/23 104 lb 11.5 oz (47.5 kg)  04/27/23 104 lb 11.5 oz (47.5 kg)  12/27/22 104 lb 12.8 oz (47.5 kg)     GEN: Well nourished, well developed, in no acute distress. HEENT: normal. Neck: Supple, no JVD, carotid bruits, or masses. Cardiac: RRR, no murmurs, rubs, or gallops. No clubbing, cyanosis, edema.  Radials/PT 2+ and equal bilaterally.  Respiratory:  Respirations regular and unlabored, clear to auscultation bilaterally. GI: Soft, nontender, nondistended. MS: No deformity or atrophy. Skin: Warm and dry, no  rash. Neuro:  Strength and sensation are intact. Psych: Normal affect.  Assessment & Plan    Chronic systolic heart failure -LVEF 35%, ,well compensated -Grade 3 DD  -euvolemic today -she remains on lasix 20mg  every other day -continue metoprolol 150mg  daily  CAD -prior stenting, continue plavix monotherapy -no chest pain or SOB  Aortic stenosis/regurgitation -mild, asymptomatic  Hypertension -well controlled today 134/86 -continue current medications  Hyperkalemia -resolved, last potassium 4.4  6. Hyperlipidemia -recheck a lipid panel  No BP recorded.  {Refresh Note OR Click here to enter BP  :1}***      Disposition: Follow up 1 year with Donato Schultz, MD or APP.  Signed, Sharlene Dory, PA-C 09/06/2023, 1:00 PM [facility]

## 2023-09-07 ENCOUNTER — Encounter: Payer: Self-pay | Admitting: Physician Assistant

## 2023-09-15 ENCOUNTER — Ambulatory Visit: Payer: Medicare Other | Attending: Cardiology | Admitting: Cardiology

## 2023-09-15 ENCOUNTER — Encounter: Payer: Self-pay | Admitting: Cardiology

## 2023-09-15 VITALS — BP 178/76 | HR 81 | Ht 67.0 in | Wt 101.2 lb

## 2023-09-15 DIAGNOSIS — I25118 Atherosclerotic heart disease of native coronary artery with other forms of angina pectoris: Secondary | ICD-10-CM | POA: Diagnosis not present

## 2023-09-15 DIAGNOSIS — I10 Essential (primary) hypertension: Secondary | ICD-10-CM | POA: Diagnosis not present

## 2023-09-15 NOTE — Patient Instructions (Signed)

## 2023-09-15 NOTE — Progress Notes (Signed)
Cardiology Office Note:  .   Date:  09/15/2023  ID:  Kristine Valdez, DOB [birth year], MRN [id number] PCP: Kaleen Mask, MD  [facility] Providers Cardiologist:  Donato Schultz, MD     History of Present Illness: Kristine Valdez   Kristine Valdez is a 89 y.o. female Discussed with the use of AI scribe   History of Present Illness   The patient, a 89 year old with a history of chronic systolic heart failure with an ejection fraction of 35%, coronary artery disease, and prior restenosis of LAD stent at 50%, presents for a follow-up visit. The patient reports significant discomfort due to arthritis in the hips, describing it as a constant ache. This discomfort is further complicated by sciatic nerve pain. Despite the pain, the patient strives to remain active and mobile as much as possible.  The patient's current medication regimen includes Plavix (clopidogrel), metoprolol (150mg ), and Lasix (furosemide), which is taken every other day to manage fluid retention. The patient occasionally skips doses of Lasix, depending on her perception of fluid retention.  The patient's heart function, as assessed by echocardiogram, remains stable. However, the patient's caregiver noted that the patient had been complaining of increased fatigue, which could be attributed to the chronic pain from arthritis and sciatica. Despite these challenges, the patient's overall condition appears to be stable with no significant changes or deterioration.        Studies Reviewed: .        Results DIAGNOSTIC Echocardiogram: Ejection fraction stable 35% EKG: Normal sinus rhythm  Risk Assessment/Calculations:           Physical Exam:   VS:  BP (!) 178/76   Pulse 81   Ht 5\' 7"  (1.702 m)   Wt 101 lb 3.2 oz (45.9 kg)   SpO2 98%   BMI 15.85 kg/m    Wt Readings from Last 3 Encounters:  09/15/23 101 lb 3.2 oz (45.9 kg)  05/03/23 104 lb 11.5 oz (47.5 kg)  04/27/23 104 lb 11.5 oz (47.5 kg)    GEN: Thin, well  developed in no acute distress NECK: No JVD; No carotid bruits CARDIAC: RRR, no murmurs, no rubs, no gallops RESPIRATORY:  Clear to auscultation without rales, wheezing or rhonchi  ABDOMEN: Soft, non-tender, non-distended EXTREMITIES:  No edema; No deformity   ASSESSMENT AND PLAN: .    Assessment and Plan    Chronic Systolic Heart Failure with Reduced Ejection Fraction (HFrEF) HFrEF with an ejection fraction of 35%, well-managed with metoprolol 150 mg daily, clopidogrel, and furosemide as needed for fluid management. Advised to monitor daily weight for fluid changes. No additional medications needed. - Continue metoprolol 150 mg daily - Continue clopidogrel - Take furosemide every other day as needed for fluid management - Monitor daily weight for fluid retention  Coronary Artery Disease (CAD) CAD with prior in-stent restenosis of LAD stent at 50%. Managed with clopidogrel to prevent further restenosis and maintain stent patency. 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 Surgical intervention not considered due to advanced age and complexity. - Continue current pain management strategies - Encourage mobility as tolerated  General Health Maintenance  - Continue current medications - Follow up in one year or sooner if needed  Follow-up - Schedule follow-up appointment in one year - Contact clinic if any issues arise before the scheduled follow-up.  Signed, Donato Schultz, MD

## 2023-09-30 ENCOUNTER — Other Ambulatory Visit: Payer: Self-pay | Admitting: Cardiology

## 2023-11-07 ENCOUNTER — Encounter: Payer: Self-pay | Admitting: Gastroenterology

## 2023-12-01 ENCOUNTER — Ambulatory Visit: Payer: Medicare Other | Admitting: Gastroenterology

## 2024-07-22 ENCOUNTER — Other Ambulatory Visit: Payer: Self-pay | Admitting: Cardiology

## 2024-07-26 ENCOUNTER — Institutional Professional Consult (permissible substitution) (INDEPENDENT_AMBULATORY_CARE_PROVIDER_SITE_OTHER): Admitting: Physician Assistant

## 2024-09-19 ENCOUNTER — Ambulatory Visit: Admitting: Cardiology

## 2024-09-30 ENCOUNTER — Ambulatory Visit (INDEPENDENT_AMBULATORY_CARE_PROVIDER_SITE_OTHER): Admitting: Physician Assistant

## 2024-09-30 ENCOUNTER — Encounter (INDEPENDENT_AMBULATORY_CARE_PROVIDER_SITE_OTHER): Payer: Self-pay | Admitting: Physician Assistant

## 2024-09-30 VITALS — BP 184/82 | HR 81 | Temp 98.1°F | Ht 66.0 in | Wt 99.0 lb

## 2024-09-30 DIAGNOSIS — H6123 Impacted cerumen, bilateral: Secondary | ICD-10-CM | POA: Diagnosis not present

## 2024-09-30 NOTE — Progress Notes (Signed)
 Patient is having BP managed.

## 2024-09-30 NOTE — Progress Notes (Signed)
 Dear Dr. Loring, Here is my assessment for our mutual patient, Kristine Valdez. Thank you for allowing me the opportunity to care for your patient. Please do not hesitate to contact me should you have any other questions. Sincerely, Chyrl Cohen PA-C  Otolaryngology Clinic Note Referring provider: Dr. Loring HPI:  Kristine Valdez is a 89 y.o. female kindly referred by Dr. Loring   Discussed the use of AI scribe software for clinical note transcription with the patient, who gave verbal consent to proceed.  History of Present Illness    Kristine Valdez is a 89 year old female who presents with concerns of impacted ear wax and hearing difficulties. She is accompanied by her daughter.  She is experiencing hearing difficulties, which she attributes to potential impacted ear wax. She wears hearing aids and finds the suction method for ear cleaning causes dizziness, so she prefers alternative methods. She also experiences tinnitus, described as buzzing and ringing in her ears.  Her last ear cleaning was over three months ago at her primary doctor's office, where a flush was performed. She has not had her ears cleaned at an ENT recently. She finds the tools used in the past, particularly suction and pen-like instruments, stressful.  She has used Debrox for ear wax removal, which she tolerates well. She is aware of options like sweet oil or mineral oil to keep the wax soft but is cautious about using these due to potential interference with her hearing aids.  Her daughter notes that past ear cleanings have not always been thorough, requiring frequent visits every three months, and sometimes leaving her feeling no different after the procedure.         Independent Review of Additional Tests or Records:  noone   PMH/Meds/All/SocHx/FamHx/ROS:   Past Medical History:  Diagnosis Date   Acute on chronic systolic congestive heart failure (HCC) 12/01/2011   Aortic regurgitation 09/01/2020    Arteriosclerosis of coronary artery 06/18/2021   Formatting of this note might be different from the original. Formatting of this note might be different from the original. Cardiac cath 12/02/11  Short LM, patent LAD stent with 50% ISR, 95% ostial jailed diagonal stenosis, dominant circumflex with no significant disease, nondominant RCA with calcified ostium                        2007   Anterior MI                         2007  DES to LAD for occ   Bilateral carotid bruits 11/21/2018   CAD (coronary artery disease)    Cardiac cath 12/02/11  Short LM, patent LAD stent with 50% ISR, 95% ostial jailed diagonal stenosis, dominant circumflex with no significant disease, nondominant RCA with calcified ostium                        2007   Anterior MI                         2007  DES to LAD for occluded LAD at site of prior J and J stent    Cataract, nuclear 08/22/2012   CHF (congestive heart failure) (HCC)    Chronic systolic congestive heart failure, NYHA class 2 (HCC)    Combined form of age-related cataract, both eyes 06/16/2021   Formatting of this note might be different from  the original. Added automatically from request for surgery [id number]   Coronary artery disease    Essential hypertension 03/05/2020   Hyperlipidemia    Hypertension    Hypertensive heart disease without CHF    Impacted cerumen of left ear 11/30/2021   Macular retinal puckering, right eye 12/01/2015   Moderate mitral regurgitation 09/22/2021   Nuclear sclerosis of both eyes 04/08/2018   Old anterior myocardial infarction    Open angle with borderline findings, low risk 08/22/2012   Open angle with borderline findings, low risk, bilateral 12/01/2015   Posterior vitreous detachment, right 04/03/2014     Past Surgical History:  Procedure Laterality Date   ABDOMINAL HYSTERECTOMY     LEFT HEART CATHETERIZATION WITH CORONARY ANGIOGRAM N/A 12/02/2011   Procedure: LEFT HEART CATHETERIZATION WITH CORONARY ANGIOGRAM;  Surgeon: Elsie GORMAN [place],  MD;  Location: [facility] CATH LAB;  Service: Cardiovascular;  Laterality: N/A;    Family History  Problem Relation Age of Onset   Heart attack Father    Coronary artery disease Brother        History of CABG   Cancer Brother    Heart failure Mother    Breast cancer Sister      Social Connections: Not on file      Current Outpatient Medications:    acetaminophen  (TYLENOL ) 325 MG tablet, Take 650 mg by mouth every 6 (six) hours as needed for mild pain., Disp: , Rfl:    clopidogrel  (PLAVIX ) 75 MG tablet, TAKE 1 TABLET BY MOUTH DAILY, Disp: 90 tablet, Rfl: 0   furosemide  (LASIX ) 20 MG tablet, TAKE 1 TABLET BY MOUTH EVERY OTHER DAY, Disp: 45 tablet, Rfl: 3   Metoprolol  Succinate 100 MG CS24, Take 1.5 tablets by mouth daily at 6 (six) AM. Pt takes 150 mg daily, Disp: , Rfl:    nitroGLYCERIN  (NITROSTAT ) 0.4 MG SL tablet, Place 0.4 mg under the tongue every 5 (five) minutes as needed for chest pain., Disp: , Rfl:    VITAMIN D PO, Take 1 capsule by mouth daily at 6 (six) AM. Pt takes 3000 units., Disp: , Rfl:    ondansetron  (ZOFRAN -ODT) 4 MG disintegrating tablet, Take 1 tablet (4 mg total) by mouth every 8 (eight) hours as needed for nausea or vomiting. (Patient not taking: Reported on 09/30/2024), Disp: 20 tablet, Rfl: 0   traMADol  (ULTRAM ) 50 MG tablet, Take 1 tablet (50 mg total) by mouth every 6 (six) hours as needed. (Patient not taking: Reported on 09/30/2024), Disp: 15 tablet, Rfl: 0   Physical Exam:   BP (!) 184/82   Pulse 81   Temp 98.1 F (36.7 C)   Ht 5' 6 (1.676 m)   Wt 99 lb (44.9 kg)   SpO2 95%   BMI 15.98 kg/m   Pertinent Findings  CN II-XII grossly intact Bilateral EACs with cerumen Anterior rhinoscopy: Septum midline No lesions of oral cavity/oropharynx No obviously palpable neck masses/lymphadenopathy/thyromegaly No respiratory distress or stridor       Seprately Identifiable Procedures:  Procedure: Bilateral ear microscopy and cerumen removal using microscope (CPT  [phone]) - Mod 50 Pre-procedure diagnosis: bilateral cerumen impaction external auditory canals Post-procedure diagnosis: same Indication: bilateral cerumen impaction; given patient's otologic complaints and history as well as for improved and comprehensive examination of external ear and tympanic membrane, bilateral otologic examination using microscope was performed and impacted cerumen removed  Procedure: Patient was placed semi-recumbent. Both ear canals were examined using the microscope with findings above. Cerumen removed from bilateral external auditory  canals using suction and currette with improvement in EAC examination and patency. Left: EAC was patent. TM was intact . Middle ear was aerated. Drainage: none Right: EAC was patent. TM was intact . Middle ear was aerated . Drainage: none Patient tolerated the procedure well.   Impression & Plans:  Kristine Valdez is a 89 y.o. female with the following   Assessment and Plan    Cerumen impaction with associated hearing loss Cerumen impaction in both ears causing hearing loss. Manual removal improved hearing. Hearing aids may contribute to wax impaction. - Use sweet oil or mineral oil in ears twice weekly to soften wax. - Consider Debrox for earwax removal. - Follow-up in six months.        - f/u 6 months   Thank you for allowing me the opportunity to care for your patient. Please do not hesitate to contact me should you have any other questions.  Sincerely, Chyrl Cohen PA-C [facility] Phone: [phone] Fax: [phone]  09/30/2024, 3:19 PM

## 2024-10-21 ENCOUNTER — Other Ambulatory Visit: Payer: Self-pay | Admitting: Cardiology

## 2024-11-12 ENCOUNTER — Telehealth: Payer: Self-pay | Admitting: Cardiology

## 2024-11-12 NOTE — Telephone Encounter (Signed)
 Pts daughter calling to inquire about medication list, pt would like a c/b regarding this matter. Please advise.

## 2024-11-12 NOTE — Telephone Encounter (Signed)
 Was given verbal permission by the patient over the phone to talk with Kristine Valdez, pt's daughter .   She wanted to go over the pt's med list.  Dr Loring switched the patient to Atenolol 50 mg.  She will no longer be taking Metoprolol  and will be starting Atenolol 50 mg due to severe fatigue and hearing issues associated with taking Metoprolol .   Med list updated  The patient has been refusing to take the Lasix  most of the time, due to it making her urinate too much.  They may try to cut the Lasix  in half= 10 mg? Asking if they can get a Furosemide  RX for 10 mg every other day instead of 20 mg every other day. They report no swelling or shortness of breath. Her most recent weight at PCP was 103.   The pt has an upcoming appt on 12/25/24 with Dr Jeffrie. Informed that I will ask her provider if this change is ok and call them back. She verbalized understanding.

## 2024-11-14 MED ORDER — FUROSEMIDE 20 MG PO TABS: 10.0000 mg | ORAL_TABLET | ORAL | 3 refills | Status: AC

## 2024-11-14 NOTE — Telephone Encounter (Signed)
 RX for Furosemide  20 mg 1/2 tablet every other day sent into [facility] for pt.

## 2024-11-14 NOTE — Telephone Encounter (Signed)
 Attempted to call back. Left message. Lasix  prescription change pending until confirm preferred pharmacy.

## 2024-11-27 ENCOUNTER — Other Ambulatory Visit: Payer: Self-pay

## 2024-11-27 ENCOUNTER — Encounter (HOSPITAL_COMMUNITY): Payer: Self-pay

## 2024-11-27 ENCOUNTER — Emergency Department (HOSPITAL_COMMUNITY): Admission: EM | Admit: 2024-11-27 | Discharge: 2024-11-27 | Disposition: A | Discharge status: Home / Self Care

## 2024-11-27 ENCOUNTER — Telehealth: Payer: Self-pay | Admitting: Cardiology

## 2024-11-27 ENCOUNTER — Emergency Department (HOSPITAL_COMMUNITY)

## 2024-11-27 DIAGNOSIS — I110 Hypertensive heart disease with heart failure: Secondary | ICD-10-CM | POA: Insufficient documentation

## 2024-11-27 DIAGNOSIS — I509 Heart failure, unspecified: Secondary | ICD-10-CM | POA: Insufficient documentation

## 2024-11-27 DIAGNOSIS — I2510 Atherosclerotic heart disease of native coronary artery without angina pectoris: Secondary | ICD-10-CM | POA: Diagnosis not present

## 2024-11-27 DIAGNOSIS — R0602 Shortness of breath: Secondary | ICD-10-CM | POA: Diagnosis present

## 2024-11-27 LAB — RESP PANEL BY RT-PCR (RSV, FLU A&B, COVID)  RVPGX2
Influenza A by PCR: NEGATIVE
Influenza B by PCR: NEGATIVE
Resp Syncytial Virus by PCR: NEGATIVE
SARS Coronavirus 2 by RT PCR: NEGATIVE

## 2024-11-27 LAB — BASIC METABOLIC PANEL WITH GFR
Anion gap: 10 (ref 5–15)
BUN: 18 mg/dL (ref 8–23)
CO2: 24 mmol/L (ref 22–32)
Calcium: 9.4 mg/dL (ref 8.9–10.3)
Chloride: 105 mmol/L (ref 98–111)
Creatinine, Ser: 1.09 mg/dL — ABNORMAL HIGH (ref 0.44–1.00)
GFR, Estimated: 46 mL/min — ABNORMAL LOW
Glucose, Bld: 107 mg/dL — ABNORMAL HIGH (ref 70–99)
Potassium: 3.9 mmol/L (ref 3.5–5.1)
Sodium: 139 mmol/L (ref 135–145)

## 2024-11-27 LAB — PRO BRAIN NATRIURETIC PEPTIDE: Pro Brain Natriuretic Peptide: 7506 pg/mL — ABNORMAL HIGH

## 2024-11-27 MED ORDER — FUROSEMIDE 10 MG/ML IJ SOLN
40.0000 mg | Freq: Once | INTRAMUSCULAR | Status: AC
  Administered 2024-11-27: 40 mg via INTRAVENOUS
  Filled 2024-11-27: qty 4

## 2024-11-27 NOTE — Telephone Encounter (Signed)
 Pt son called in to report pt woke up with new onset SOB.  Reports pt has not taken furosemide  10 mg PO every other day as ordered.  Advised to have pt take medication to see if helps with SOB.  Pt has a hx of CHF and fluid can build up and cause SOB.  Denies leg/ abdomen swelling does not check daily weight.  Reports no difficulty taking a deep breath.   Advised son to monitor medication adherence and to notify our office if after taking furosemide  doesn't notice much UOP or SOB doesn't improve.  Son reports he recently had surgery so hasn't monitored medications like should but will be more mindful.  Pt has an OV scheduled for 12/25/24 at 11 am but will call back if needed prior to appointment.

## 2024-11-27 NOTE — ED Notes (Signed)
 Pt ambulated with pulse ox and remained between 94-97 RA. Provider notified.

## 2024-11-27 NOTE — ED Provider Notes (Signed)
 "  EMERGENCY DEPARTMENT AT [facility] Provider Note   CSN: [id number] Arrival date & time: 11/27/24  1438     Patient presents with: Weakness and Shortness of Breath   Kristine Valdez is a 89 y.o. female.   89 year old female with past medical history of CHF, coronary artery disease, and hypertension presenting to the emergency department today with concerns for generalized weakness and shortness of breath.  This was noted by her family earlier today.  The patient was apparently complaining of some shortness of breath and was generally weak at that time.  Reports that she was in her normal state of health yesterday.  They have not noticed any blood in her stool or dark stools.  She is not been complaining of chest pain.  Denies any fevers or cough.  She was brought in today for further evaluation regarding this due to the symptoms.   Weakness Associated symptoms: shortness of breath   Shortness of Breath      Prior to Admission medications  Medication Sig Start Date End Date Taking? Authorizing Provider  acetaminophen  (TYLENOL ) 325 MG tablet Take 650 mg by mouth every 6 (six) hours as needed for mild pain.    [provider]  atenolol (TENORMIN) 50 MG tablet Take 50 mg by mouth daily.    [provider]  clopidogrel  (PLAVIX ) 75 MG tablet TAKE 1 TABLET BY MOUTH DAILY 10/22/24   Jeffrie Oneil BROCKS, MD  furosemide  (LASIX ) 20 MG tablet Take 0.5 tablets (10 mg total) by mouth every other day. 11/14/24   Jeffrie Oneil BROCKS, MD  nitroGLYCERIN  (NITROSTAT ) 0.4 MG SL tablet Place 0.4 mg under the tongue every 5 (five) minutes as needed for chest pain.    [provider]  VITAMIN D PO Take 1 capsule by mouth daily at 6 (six) AM. Pt takes 3000 units.    [provider]    Allergies: Cortisone, Penicillins, Statins, Ace inhibitors, Codeine, Hydrocodone -acetaminophen , Angiotensin receptor blockers, Digoxin  and related, Hydrocodone , and Daucus carota     Review of Systems  Respiratory:  Positive for shortness of breath.   Neurological:  Positive for weakness.  All other systems reviewed and are negative.   Updated Vital Signs BP (!) 168/90   Pulse 84   Temp 98 F (36.7 C)   Resp (!) 25   Ht 5' 7 (1.702 m)   SpO2 90%   BMI 15.51 kg/m   Physical Exam Vitals and nursing note reviewed.   Gen: NAD Eyes: PERRL, EOMI HEENT: no oropharyngeal swelling Neck: trachea midline Resp: Diminished breath sounds in the right lower lung fields Card: RRR, no murmurs, rubs, or gallops Abd: nontender, nondistended Extremities: no calf tenderness, no edema Vascular: 2+ radial pulses bilaterally, 2+ DP pulses bilaterally Neuro: No focal deficits Skin: no rashes Psyc: acting appropriately   (all labs ordered are listed, but only abnormal results are displayed) Labs Reviewed  BASIC METABOLIC PANEL WITH GFR - Abnormal; Notable for the following components:      Result Value   Glucose, Bld 107 (*)    Creatinine, Ser 1.09 (*)    GFR, Estimated 46 (*)    All other components within normal limits  PRO BRAIN NATRIURETIC PEPTIDE - Abnormal; Notable for the following components:   Pro Brain Natriuretic Peptide 7,506.0 (*)    All other components within normal limits  RESP PANEL BY RT-PCR (RSV, FLU A&B, COVID)  [id number]    EKG: EKG Interpretation Date/Time:  Wednesday November 27 2024 15:57:14 EST Ventricular Rate:  71 PR Interval:  189 QRS Duration:  96 QT Interval:  444 QTC Calculation: 483 R Axis:   -18  Text Interpretation: Sinus rhythm LVH with secondary repolarization abnormality Inferior infarct, old Nonspecific ST abnormality Confirmed by Ula Barter [phone]) on 11/27/2024 4:15:53 PM  Radiology: DG Chest 2 View Result Date: 11/27/2024 EXAM: 2 VIEW(S) XRAY OF THE CHEST 11/27/2024 03:35:00 PM COMPARISON: Comparison with 12/28/2021. CLINICAL HISTORY: Shortness of breath. FINDINGS: LUNGS AND PLEURA: Trace bilateral pleural effusions.  Diffuse interstitial prominence and bibasilar airspace opacities, likely edema. No pneumothorax. HEART AND MEDIASTINUM: Cardiomegaly. Atherosclerotic calcifications of thoracic aorta. BONES AND SOFT TISSUES: Multilevel degenerative changes of thoracic spine. IMPRESSION: 1. Diffuse interstitial prominence and bibasilar airspace opacities, likely edema. 2. Trace bilateral pleural effusions. 3. Cardiomegaly with atherosclerotic calcifications of the thoracic aorta. Electronically signed by: Elsie Gravely MD 11/27/2024 04:02 PM EST RP Workstation: [id number]     Procedures   Medications Ordered in the ED  furosemide  (LASIX ) injection 40 mg (40 mg Intravenous Given 11/27/24 1805)                                    Medical Decision Making 89 year old female with past medical history of CHF, coronary artery disease, and hypertension presenting to the emergency department today with generalized weakness and shortness of breath.  I will further evaluate the patient here with basic labs as well as an EKG and chest x-ray to evaluate for pulmonary edema, pulmonary infiltrates, pneumothorax, anemia, or electrolyte abnormalities.  Also obtain an RSV/COVID/flu swab on the patient to evaluate for viral etiologies.  The patient is saturating well here on room air.  Suspicion for aortic pathology or pulmonary embolism is low at this time.  I will reevaluate for ultimate disposition.  The patient's workup here is consistent with CHF exacerbation.  Her ambulatory pulse ox was in the mid 90s.  She was given a dose of Lasix  here.  On reassessment her pulse ox is 93 to 94% on room air.  We discussed risks and benefits of hospitalization and ultimately the patient is discharged through shared decision making.  I have placed a cardiology consult to have the patient reevaluated.  She is discharged with return precautions.  Amount and/or Complexity of Data Reviewed Labs: ordered. Radiology: ordered.  Risk Prescription  drug management.        Final diagnoses:  Acute on chronic congestive heart failure, unspecified heart failure type [facility])    ED Discharge Orders          Ordered    Ambulatory referral to Cardiology       Comments: If you have not heard from the Cardiology office within the next 72 hours please call [phone].   11/27/24 1930               Ula Barter SAUNDERS, MD 11/27/24 1931  "

## 2024-11-27 NOTE — Discharge Instructions (Addendum)
 Please take 10 mg of the furosemide  (1/2 of a tablet) daily for the next 5 to 7 days.  You may then go back to taking this every other day as discussed with your cardiologist.  Please return to the ER for worsening symptoms.  I have placed a consult to cardiology so they should try to get you in the clinic for reevaluation in the next week or 2.

## 2024-11-27 NOTE — ED Triage Notes (Signed)
 Pt to ED via [facility] from home c/o generalized weakness and exertional SHOB since 9am today. Denies CP. A&O x 4. Pt is HOH and does not have her hearing aids with her.   No medications given by EMS.  Last VS: 148/84, CBG 152, hr 80, 92%RA.  #20 LAC.

## 2024-11-27 NOTE — ED Notes (Signed)
 Pts son requested to speak to provider again, Provider notified.

## 2024-11-27 NOTE — Telephone Encounter (Signed)
Pt c/o Shortness Of Breath: STAT if SOB developed within the last 24 hours or pt is noticeably SOB on the phone  1. Are you currently SOB (can you hear that pt is SOB on the phone)? Yes   2. How long have you been experiencing SOB? This morning  3. Are you SOB when sitting or when up moving around? Moving around  4. Are you currently experiencing any other symptoms? No

## 2024-12-20 ENCOUNTER — Ambulatory Visit: Admitting: Cardiology

## 2024-12-25 ENCOUNTER — Ambulatory Visit: Admitting: Cardiology

## 2025-04-02 ENCOUNTER — Ambulatory Visit (INDEPENDENT_AMBULATORY_CARE_PROVIDER_SITE_OTHER): Admitting: Physician Assistant
